# Patient Record
Sex: Male | Born: 1970 | Race: White | Marital: Married | Smoking: Never smoker
Health system: Southern US, Community
[De-identification: ages and names within clinical notes are randomized; demographics above are authoritative.]

## PROBLEM LIST (undated history)

## (undated) DIAGNOSIS — M549 Dorsalgia, unspecified: Secondary | ICD-10-CM

## (undated) HISTORY — PX: FOOT SURGERY: SHX648

---

## 2020-06-28 ENCOUNTER — Other Ambulatory Visit: Payer: Self-pay | Admitting: Family Medicine

## 2020-06-28 DIAGNOSIS — R1012 Left upper quadrant pain: Secondary | ICD-10-CM

## 2020-08-10 ENCOUNTER — Inpatient Hospital Stay: Admission: RE | Admit: 2020-08-10 | Payer: Self-pay | Source: Ambulatory Visit

## 2020-08-10 ENCOUNTER — Other Ambulatory Visit: Payer: Self-pay

## 2020-08-16 ENCOUNTER — Other Ambulatory Visit: Payer: Self-pay | Admitting: Family Medicine

## 2020-08-16 DIAGNOSIS — R1012 Left upper quadrant pain: Secondary | ICD-10-CM

## 2020-08-16 DIAGNOSIS — R0781 Pleurodynia: Secondary | ICD-10-CM

## 2020-09-07 ENCOUNTER — Other Ambulatory Visit: Payer: Self-pay

## 2020-09-07 ENCOUNTER — Ambulatory Visit
Admission: RE | Admit: 2020-09-07 | Discharge: 2020-09-07 | Disposition: A | Discharge status: Home / Self Care | Payer: BC Managed Care – PPO | Source: Ambulatory Visit | Attending: Family Medicine | Admitting: Family Medicine

## 2020-09-07 DIAGNOSIS — R0781 Pleurodynia: Secondary | ICD-10-CM

## 2020-09-07 DIAGNOSIS — R1012 Left upper quadrant pain: Secondary | ICD-10-CM

## 2020-09-22 ENCOUNTER — Ambulatory Visit: Payer: BC Managed Care – PPO | Admitting: Sports Medicine

## 2020-09-22 ENCOUNTER — Other Ambulatory Visit: Payer: Self-pay

## 2020-09-22 VITALS — Ht 67.0 in | Wt 158.0 lb

## 2020-09-22 DIAGNOSIS — R0781 Pleurodynia: Secondary | ICD-10-CM | POA: Insufficient documentation

## 2020-09-22 NOTE — Assessment & Plan Note (Signed)
His symptoms do not seem to be occurring in a particular pattern and cannot be explained by any findings on physical exam or an MRI.  It is possible that there is a nerve impingement occurring in the thoracic region that is causing his pain.  We will discuss this case with Dr. Lucie Leather, PM&R physician at [facility], to see if he thinks a nerve block would be feasible in this situation, as it could likely be diagnostic and therapeutic for this interesting case.  Will go ahead and refer for appointment.

## 2020-09-22 NOTE — Progress Notes (Signed)
   Darius Young is a 50 y.o. male who presents to [facility] today for the following:  Left Rib Pain 2 years Off and on  Occurs randomly PCP referred to Dr. 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" States that the pain can occur randomly States that when he works out at the gym it does not bother him States that sometimes he will be sitting at rest and randomly get the pain Pain is mostly along his 10th rib on the left States that he is unable to lie on his left side at night and it wakes him from sleep States that occasionally the pain is so severe that it shoots from the front of his rib to the back of his ribs and up his left arm and into his fingertips He describes that particular pain is a burning and aching He denies any arm weakness He states it is very variable, sometimes he can go a few weeks without having the pain, other times he can spend over a month with pain every day No radiation to legs  PMH reviewed.  ROS as above. Medications reviewed.  Exam:  Ht 5\' 7"  (1.702 m)   Wt 158 lb (71.7 kg)   BMI 24.75 kg/m  Gen: Well NAD MSK:  Rib, Thoracic and Lumbar spine:  - Inspection: no gross deformity or asymmetry, swelling or ecchymosis - Palpation: No TTP over the spinous processes of thoracic or lumbar spine, paraspinal muscles, or left lower ribs - ROM: full active ROM of the lumbar spine in flexion and extension without pain, very mild thoracic scoliosis  - Strength: 5/5 strength of BLE and BUE, normal gait - Neuro: sensation intact throughout BUE/BLE, 2+ C5-C7 reflexes   Assessment and Plan: 1) Rib pain on left side His symptoms do not seem to be occurring in a particular pattern and cannot be explained by any findings on physical exam or an MRI.  It is  possible that there is a nerve impingement occurring in the thoracic region that is causing his pain.  We will discuss this case with Dr. , PM&R physician at [facility], to see if he thinks a nerve block would be feasible in this situation, as it could likely be diagnostic and therapeutic for this interesting case.  Will go ahead and refer for appointment.     [facility], D.O.  PGY-4 [facility]  09/22/2020 4:34 PM  Patient seen and evaluated with the sports medicine fellow.  I agree with the above plan of care.  Patient's diagnosis is not straightforward.  I did review the MRI of his chest which is negative for any musculoskeletal pathology.  He may benefit from a diagnostic intercostal nerve block.  We will discuss this with Dr. 09/24/2020 at [facility].  Follow-up with me as needed.

## 2020-09-22 NOTE — Patient Instructions (Addendum)
Thank you for coming to see me today. It was a pleasure. Today we talked about:   You will be contacted with an appointment fr Dr. Lucie Leather by [facility].  Their number is [phone] if you don't hear from them by next week.  The address is [address], [address], [place], [place] [address].  We will talk with him about your case and see if he thinks he would be able to help.  If not, we will contact you to cancel the appointment and for next steps.  If you have any questions or concerns, please do not hesitate to call the office at [phone].  Best,   Luis Abed, DO [facility]

## 2020-09-23 NOTE — Progress Notes (Signed)
Referral to Dr. Jake Church at [facility] to discuss left rib pain - possible diagnostic intercostal nerve block.  [address] [place], [place] [address] Phone: [phone]  They will contact pt to schedule appt.

## 2021-05-12 ENCOUNTER — Other Ambulatory Visit: Payer: Self-pay | Admitting: Family Medicine

## 2021-05-12 DIAGNOSIS — R1031 Right lower quadrant pain: Secondary | ICD-10-CM

## 2021-05-12 DIAGNOSIS — R29810 Facial weakness: Secondary | ICD-10-CM

## 2021-05-12 DIAGNOSIS — M542 Cervicalgia: Secondary | ICD-10-CM

## 2021-05-12 DIAGNOSIS — M546 Pain in thoracic spine: Secondary | ICD-10-CM

## 2021-05-18 ENCOUNTER — Ambulatory Visit
Admission: RE | Admit: 2021-05-18 | Discharge: 2021-05-18 | Disposition: A | Discharge status: Home / Self Care | Payer: BC Managed Care – PPO | Source: Ambulatory Visit | Attending: Family Medicine | Admitting: Family Medicine

## 2021-05-18 DIAGNOSIS — R1031 Right lower quadrant pain: Secondary | ICD-10-CM

## 2021-05-26 ENCOUNTER — Other Ambulatory Visit: Payer: Self-pay | Admitting: Family Medicine

## 2021-05-26 DIAGNOSIS — R29810 Facial weakness: Secondary | ICD-10-CM

## 2021-05-26 DIAGNOSIS — R1031 Right lower quadrant pain: Secondary | ICD-10-CM

## 2021-05-26 DIAGNOSIS — M546 Pain in thoracic spine: Secondary | ICD-10-CM

## 2021-05-26 DIAGNOSIS — M542 Cervicalgia: Secondary | ICD-10-CM

## 2021-05-31 ENCOUNTER — Other Ambulatory Visit: Payer: BC Managed Care – PPO

## 2021-05-31 ENCOUNTER — Ambulatory Visit
Admission: RE | Admit: 2021-05-31 | Discharge: 2021-05-31 | Disposition: A | Discharge status: Home / Self Care | Payer: BC Managed Care – PPO | Source: Ambulatory Visit | Attending: Family Medicine | Admitting: Family Medicine

## 2021-05-31 DIAGNOSIS — R29810 Facial weakness: Secondary | ICD-10-CM

## 2021-05-31 DIAGNOSIS — R1031 Right lower quadrant pain: Secondary | ICD-10-CM

## 2021-05-31 DIAGNOSIS — M546 Pain in thoracic spine: Secondary | ICD-10-CM

## 2021-05-31 DIAGNOSIS — M542 Cervicalgia: Secondary | ICD-10-CM

## 2021-05-31 MED ORDER — GADOBENATE DIMEGLUMINE 529 MG/ML IV SOLN
15.0000 mL | Freq: Once | INTRAVENOUS | Status: AC | PRN
  Administered 2021-05-31: 15 mL via INTRAVENOUS

## 2021-06-16 ENCOUNTER — Encounter: Payer: Self-pay | Admitting: Urology

## 2021-06-16 ENCOUNTER — Ambulatory Visit: Payer: BC Managed Care – PPO | Admitting: Urology

## 2021-06-16 VITALS — BP 110/66 | HR 62

## 2021-06-16 DIAGNOSIS — L729 Follicular cyst of the skin and subcutaneous tissue, unspecified: Secondary | ICD-10-CM

## 2021-06-16 DIAGNOSIS — K4090 Unilateral inguinal hernia, without obstruction or gangrene, not specified as recurrent: Secondary | ICD-10-CM

## 2021-06-16 DIAGNOSIS — N433 Hydrocele, unspecified: Secondary | ICD-10-CM

## 2021-06-16 DIAGNOSIS — N50811 Right testicular pain: Secondary | ICD-10-CM | POA: Diagnosis not present

## 2021-06-16 MED ORDER — MELOXICAM 7.5 MG PO TABS: 7.5000 mg | ORAL_TABLET | Freq: Every day | ORAL | 1 refills | Status: DC

## 2021-06-16 NOTE — Progress Notes (Signed)
   06/16/2021 11:36 AM   Gillis Ends March 16, 1970 [id number]  Referring provider: Garnette Gunner, MD [address] [place],  [place] [address]  Right testicular pain   HPI: Mr Bartolucci is a 50yo here for evaluation of right testis pain. Starting over 1 month he developed right testis pain while mowing his lawn. He underwent scrotal US which showed right cystic structure adjacent to right testis and left epididymal head cysts. The pain resolved and then recurred 1 week ago and has persisted. He was lifting when the pain occurred. No significant LUTS. No other complaints today.   PMH: No past medical history on file.  Surgical History:   Home Medications:  Allergies as of 06/16/2021   No Known Allergies      Medication List        Accurate as of Jun 16, 2021 11:36 AM. If you have any questions, ask your nurse or doctor.          cetirizine 10 MG tablet Commonly known as: ZYRTEC Take 10 mg by mouth daily as needed.   ibuprofen 800 MG tablet Commonly known as: ADVIL Take 800 mg by mouth 3 (three) times daily.        Allergies: No Known Allergies  Family History: No family history on file.  Social History:  has no history on file for tobacco use, alcohol use, and drug use.  ROS: All other review of systems were reviewed and are negative except what is noted above in HPI  Physical Exam: BP 110/66   Pulse 62   Constitutional:  Alert and oriented, No acute distress. HEENT: Shell AT, moist mucus membranes.  Trachea midline, no masses. Cardiovascular: No clubbing, cyanosis, or edema. Respiratory: Normal respiratory effort, no increased work of breathing. GI: Abdomen is soft, nontender, nondistended, no abdominal masses GU: No CVA tenderness. Circumcised phallus. No masses/lesions on penis, testis, scrotum.  Small right inguinal hernia Lymph: No cervical or inguinal lymphadenopathy. Skin: No rashes, bruises or suspicious lesions. Neurologic: Grossly intact, no focal  deficits, moving all 4 extremities. Psychiatric: Normal mood and affect.  Laboratory Data: No results found for: WBC, HGB, HCT, MCV, PLT  No results found for: CREATININE  No results found for: PSA  No results found for: TESTOSTERONE  No results found for: HGBA1C  Urinalysis No results found for: COLORURINE, APPEARANCEUR, LABSPEC, PHURINE, GLUCOSEU, HGBUR, BILIRUBINUR, KETONESUR, PROTEINUR, UROBILINOGEN, NITRITE, LEUKOCYTESUR  No results found for: LABMICR, WBCUA, RBCUA, LABEPIT, MUCUS, BACTERIA  Pertinent Imaging: Scrotal US 05/18/2021: Images reviewed and discussed with the patient No results found for this or any previous visit.  No results found for this or any previous visit.  No results found for this or any previous visit.  No results found for this or any previous visit.  No results found for this or any previous visit.  No results found for this or any previous visit.  No results found for this or any previous visit.  No results found for this or any previous visit.   Assessment & Plan:    1. Right inguinal hernia -referral to Dr. Lovell Sheehan  2. Right orchalgia -meloxicam 7.5mg  daily   No follow-ups on file.  Wilkie Aye, MD  [facility] [place]

## 2021-06-16 NOTE — Patient Instructions (Signed)
Hernia, Adult     A hernia is the bulging of an organ or tissue through a weak spot in the muscles of the abdomen. Hernias develop most often near the belly button (navel) or the area where the leg meets the lower abdomen (groin). Common types of hernias include: Incisional hernia. This type bulges through a scar from an abdominal surgery. Umbilical hernia. This type develops near the navel. Inguinal hernia. This type develops in the groin or scrotum. Femoral hernia. This type develops below the groin, in the upper thigh area. Hiatal hernia. This type occurs when part of the stomach slides above the muscle that separates the abdomen from the chest (diaphragm). What are the causes? This condition may be caused by: Heavy lifting. Coughing over a long period of time. Straining to have a bowel movement. Constipation can lead to straining. An incision made during abdominal surgery. A physical problem that is present at birth (congenital defect). Being overweight or obese. Smoking. Excess fluid in the abdomen. Undescended testicles in males. What are the signs or symptoms? The main symptom is a skin-colored, rounded bulge in the area of the hernia. However, a bulge may not always be present. It may grow bigger or be more visible when you cough or strain (such as when lifting something heavy). A hernia that can be pushed back into the abdomen (is reducible) rarely causes pain. A hernia that cannot be pushed back into the abdomen (is incarcerated) may lose its blood supply (become strangulated). A hernia that is incarcerated may cause: Pain. Fever. Nausea and vomiting. Swelling. Constipation. How is this diagnosed? A hernia may be diagnosed based on: Your symptoms and medical history. A physical exam. Your health care provider may ask you to cough or move in certain ways to see if the hernia becomes visible. Imaging tests, such as: X-rays. Ultrasound. CT scan. How is this treated? A  hernia that is small and painless may not need to be treated. A hernia that is large or painful may be treated with surgery. Inguinal hernias may be treated with surgery to prevent incarceration or strangulation. Strangulated hernias are always treated with surgery because the strangulation causes a lack of blood supply to the trapped organ or tissue. Surgery to treat a hernia involves pushing the bulge back into place and repairing the weak area of the muscle or abdominal wall. Follow these instructions at home: Activity Avoid straining. Do not lift anything that is heavier than 10 lb (4.5 kg), or the limit that you are told, until your health care provider says that it is safe. When lifting heavy objects, lift with your leg muscles, not your back muscles. Preventing constipation Take actions to prevent constipation. Constipation leads to straining with bowel movements, which can make a hernia worse or cause a hernia repair to break down. Your health care provider may recommend that you take these actions to prevent or treat constipation: Drink enough fluid to keep your urine pale yellow. Take over-the-counter or prescription medicines. Eat foods that are high in fiber, such as beans, whole grains, and fresh fruits and vegetables. Limit foods that are high in fat and processed sugars, such as fried or sweet foods. General instructions When coughing, try to cough gently. You may try to push the hernia back in place by very gently pressing on it while lying down. Do not try to force the bulge back in if it will not push in easily. If you are overweight, work with your health care provider   to lose weight safely. Do not use any products that contain nicotine or tobacco. These products include cigarettes, chewing tobacco, and vaping devices, such as e-cigarettes. If you need help quitting, ask your health care provider. If you are scheduled for hernia repair, watch your hernia for any changes in shape,  size, or color. Tell your health care provider about any changes or new symptoms. Take over-the-counter and prescription medicines only as told by your health care provider. Keep all follow-up visits. This is important. Contact a health care provider if: You develop new pain, swelling, or redness around your hernia. You have signs of constipation, such as: Fewer bowel movements in a week than normal. Difficulty having a bowel movement. Stools that are dry, hard, or larger than normal. Get help right away if: You have a fever or chills. You have abdominal pain that gets worse. You feel nauseous or you vomit. You cannot push the hernia back in place by very gently pressing on it while lying down. Do not try to force the bulge back in if it will not go in easily. The hernia: Changes in shape, size, or color. Feels hard or tender. These symptoms may represent a serious problem that is an emergency. Do not wait to see if the symptoms will go away. Get medical help right away. Call your local emergency services (911 in the [place] Do not drive yourself to the hospital. Summary A hernia is the bulging of an organ or tissue through a weak spot in the muscles of the abdomen. The main symptom is a skin-colored bulge in the hernia area. However, a bulge may not always be present. It may grow bigger or more visible when you cough or strain (such as when having a bowel movement). A hernia that is small and painless may not need to be treated. A hernia that is large or painful may be treated with surgery. Surgery to treat a hernia involves pushing the bulge back into place and repairing the weak part of the abdomen. This information is not intended to replace advice given to you by your health care provider. Make sure you discuss any questions you have with your health care provider. Document Revised: 08/31/2019 Document Reviewed: 08/31/2019 Elsevier Patient Education  2023 Elsevier Inc.  

## 2021-07-04 ENCOUNTER — Ambulatory Visit: Payer: BC Managed Care – PPO | Admitting: General Surgery

## 2021-07-04 ENCOUNTER — Encounter: Payer: Self-pay | Admitting: General Surgery

## 2021-07-04 VITALS — BP 112/75 | HR 67 | Temp 98.1°F | Resp 12 | Ht 67.0 in | Wt 177.0 lb

## 2021-07-04 DIAGNOSIS — K4090 Unilateral inguinal hernia, without obstruction or gangrene, not specified as recurrent: Secondary | ICD-10-CM

## 2021-07-05 NOTE — Progress Notes (Signed)
Darius Young; [id number]; 24-Jun-1970   HPI Patient is a 51 year old white male who was referred to my care by Dr. Alyson Ingles of urology for evaluation and treatment of a right inguinal hernia.  Patient was seeing Dr. Alyson Ingles for right testicular pain.  He was noted on examination to have a small right inguinal hernia.  Patient states that when he strains, he does have intermittent pain in the right groin area that radiates down to the right testicle.  He has had this for many months but is now starting to bother him more.  It is made worse with working out. History reviewed. No pertinent past medical history.  History reviewed. No pertinent surgical history.  History reviewed. No pertinent family history.  Current Outpatient Medications on File Prior to Visit  Medication Sig Dispense Refill   cetirizine (ZYRTEC) 10 MG tablet Take 10 mg by mouth daily as needed.     ibuprofen (ADVIL) 800 MG tablet Take 800 mg by mouth 3 (three) times daily.     meloxicam (MOBIC) 7.5 MG tablet Take 1 tablet (7.5 mg total) by mouth daily. 30 tablet 1   No current facility-administered medications on file prior to visit.    No Known Allergies  Social History   Substance and Sexual Activity  Alcohol Use Not Currently   Comment: Social    Social History   Tobacco Use  Smoking Status Never  Smokeless Tobacco Former   Types: Chew    Review of Systems  Constitutional: Negative.   HENT:  Positive for sinus pain.   Eyes: Negative.   Respiratory: Negative.    Cardiovascular: Negative.   Gastrointestinal:  Positive for heartburn.  Genitourinary:  Positive for frequency.  Musculoskeletal:  Positive for back pain, joint pain and neck pain.  Skin: Negative.   Neurological: Negative.   Endo/Heme/Allergies: Negative.   Psychiatric/Behavioral: Negative.     Objective   Vitals:   07/04/21 1436  BP: 112/75  Pulse: 67  Resp: 12  Temp: 98.1 F (36.7 C)  SpO2: 98%    Physical Exam Vitals  reviewed.  Constitutional:      Appearance: Normal appearance. He is normal weight. He is not ill-appearing.  HENT:     Head: Normocephalic and atraumatic.  Cardiovascular:     Rate and Rhythm: Normal rate and regular rhythm.     Heart sounds: Normal heart sounds. No murmur heard.   No friction rub. No gallop.  Pulmonary:     Effort: Pulmonary effort is normal. No respiratory distress.     Breath sounds: Normal breath sounds. No stridor. No wheezing, rhonchi or rales.  Abdominal:     General: Abdomen is flat. Bowel sounds are normal. There is no distension.     Palpations: Abdomen is soft. There is no mass.     Tenderness: There is no abdominal tenderness. There is no guarding or rebound.     Hernia: A hernia is present.     Comments: Small easily reducible right inguinal hernia noted.  Palpation over the internal ring recreates his pain.  Genitourinary:    Testes: Normal.  Skin:    General: Skin is warm and dry.  Neurological:     Mental Status: He is alert and oriented to person, place, and time.   Urology notes reviewed Assessment  Right inguinal hernia Plan  Patient is scheduled for right inguinal herniorrhaphy with mesh on 07/21/2021.  The risks and benefits of the procedure including bleeding, infection, mesh use, the possibility of recurrence  of the hernia were fully explained to the patient, who gave informed consent.

## 2021-07-05 NOTE — H&P (Signed)
Darius Young; [id number]; 01/16/1971   HPI Patient is a 50-year-old white male who was referred to my care by Dr. McKenzie of urology for evaluation and treatment of a right inguinal hernia.  Patient was seeing Dr. McKenzie for right testicular pain.  He was noted on examination to have a small right inguinal hernia.  Patient states that when he strains, he does have intermittent pain in the right groin area that radiates down to the right testicle.  He has had this for many months but is now starting to bother him more.  It is made worse with working out. History reviewed. No pertinent past medical history.  History reviewed. No pertinent surgical history.  History reviewed. No pertinent family history.  Current Outpatient Medications on File Prior to Visit  Medication Sig Dispense Refill   cetirizine (ZYRTEC) 10 MG tablet Take 10 mg by mouth daily as needed.     ibuprofen (ADVIL) 800 MG tablet Take 800 mg by mouth 3 (three) times daily.     meloxicam (MOBIC) 7.5 MG tablet Take 1 tablet (7.5 mg total) by mouth daily. 30 tablet 1   No current facility-administered medications on file prior to visit.    No Known Allergies  Social History   Substance and Sexual Activity  Alcohol Use Not Currently   Comment: Social    Social History   Tobacco Use  Smoking Status Never  Smokeless Tobacco Former   Types: Chew    Review of Systems  Constitutional: Negative.   HENT:  Positive for sinus pain.   Eyes: Negative.   Respiratory: Negative.    Cardiovascular: Negative.   Gastrointestinal:  Positive for heartburn.  Genitourinary:  Positive for frequency.  Musculoskeletal:  Positive for back pain, joint pain and neck pain.  Skin: Negative.   Neurological: Negative.   Endo/Heme/Allergies: Negative.   Psychiatric/Behavioral: Negative.     Objective   Vitals:   07/04/21 1436  BP: 112/75  Pulse: 67  Resp: 12  Temp: 98.1 F (36.7 C)  SpO2: 98%    Physical Exam Vitals  reviewed.  Constitutional:      Appearance: Normal appearance. He is normal weight. He is not ill-appearing.  HENT:     Head: Normocephalic and atraumatic.  Cardiovascular:     Rate and Rhythm: Normal rate and regular rhythm.     Heart sounds: Normal heart sounds. No murmur heard.   No friction rub. No gallop.  Pulmonary:     Effort: Pulmonary effort is normal. No respiratory distress.     Breath sounds: Normal breath sounds. No stridor. No wheezing, rhonchi or rales.  Abdominal:     General: Abdomen is flat. Bowel sounds are normal. There is no distension.     Palpations: Abdomen is soft. There is no mass.     Tenderness: There is no abdominal tenderness. There is no guarding or rebound.     Hernia: A hernia is present.     Comments: Small easily reducible right inguinal hernia noted.  Palpation over the internal ring recreates his pain.  Genitourinary:    Testes: Normal.  Skin:    General: Skin is warm and dry.  Neurological:     Mental Status: He is alert and oriented to person, place, and time.   Urology notes reviewed Assessment  Right inguinal hernia Plan  Patient is scheduled for right inguinal herniorrhaphy with mesh on 07/21/2021.  The risks and benefits of the procedure including bleeding, infection, mesh use, the possibility of recurrence   of the hernia were fully explained to the patient, who gave informed consent. 

## 2021-07-14 ENCOUNTER — Ambulatory Visit: Payer: BC Managed Care – PPO | Admitting: Urology

## 2021-07-19 ENCOUNTER — Encounter (HOSPITAL_COMMUNITY): Payer: Self-pay

## 2021-07-19 ENCOUNTER — Encounter (HOSPITAL_COMMUNITY)
Admission: RE | Admit: 2021-07-19 | Discharge: 2021-07-19 | Disposition: A | Discharge status: Home / Self Care | Payer: BC Managed Care – PPO | Source: Ambulatory Visit | Attending: General Surgery | Admitting: General Surgery

## 2021-07-19 HISTORY — DX: Dorsalgia, unspecified: M54.9

## 2021-07-21 ENCOUNTER — Other Ambulatory Visit: Payer: Self-pay

## 2021-07-21 ENCOUNTER — Ambulatory Visit (HOSPITAL_COMMUNITY): Payer: BC Managed Care – PPO

## 2021-07-21 ENCOUNTER — Encounter (HOSPITAL_COMMUNITY)
Admission: RE | Disposition: A | Discharge status: Home / Self Care | Payer: Self-pay | Source: Home / Self Care | Attending: General Surgery

## 2021-07-21 ENCOUNTER — Ambulatory Visit (HOSPITAL_COMMUNITY)
Admission: RE | Admit: 2021-07-21 | Discharge: 2021-07-21 | Disposition: A | Discharge status: Home / Self Care | Payer: BC Managed Care – PPO | Attending: General Surgery | Admitting: General Surgery

## 2021-07-21 ENCOUNTER — Encounter (HOSPITAL_COMMUNITY): Payer: Self-pay | Admitting: General Surgery

## 2021-07-21 DIAGNOSIS — K4090 Unilateral inguinal hernia, without obstruction or gangrene, not specified as recurrent: Secondary | ICD-10-CM

## 2021-07-21 SURGERY — REPAIR, HERNIA, INGUINAL, ADULT
Anesthesia: General | Site: Inguinal | Laterality: Right

## 2021-07-21 MED ORDER — KETOROLAC TROMETHAMINE 30 MG/ML IJ SOLN
INTRAMUSCULAR | Status: DC | PRN
  Administered 2021-07-21 (×2): 15 mg via INTRAVENOUS

## 2021-07-21 MED ORDER — LACTATED RINGERS IV SOLN: INTRAVENOUS | Status: DC

## 2021-07-21 MED ORDER — MIDAZOLAM HCL 2 MG/2ML IJ SOLN
INTRAMUSCULAR | Status: AC
  Filled 2021-07-21: qty 2

## 2021-07-21 MED ORDER — CHLORHEXIDINE GLUCONATE CLOTH 2 % EX PADS: 6.0000 | MEDICATED_PAD | Freq: Once | CUTANEOUS | Status: DC

## 2021-07-21 MED ORDER — PROPOFOL 10 MG/ML IV BOLUS
INTRAVENOUS | Status: AC
  Filled 2021-07-21: qty 20

## 2021-07-21 MED ORDER — CEFAZOLIN SODIUM-DEXTROSE 2-4 GM/100ML-% IV SOLN
2.0000 g | INTRAVENOUS | Status: AC
  Administered 2021-07-21: 2 g via INTRAVENOUS
  Filled 2021-07-21: qty 100

## 2021-07-21 MED ORDER — KETOROLAC TROMETHAMINE 30 MG/ML IJ SOLN
INTRAMUSCULAR | Status: AC
  Filled 2021-07-21: qty 1

## 2021-07-21 MED ORDER — LACTATED RINGERS IV SOLN: INTRAVENOUS | Status: DC | PRN

## 2021-07-21 MED ORDER — ONDANSETRON HCL 4 MG/2ML IJ SOLN
INTRAMUSCULAR | Status: DC | PRN
  Administered 2021-07-21: 4 mg via INTRAVENOUS

## 2021-07-21 MED ORDER — CHLORHEXIDINE GLUCONATE 0.12 % MT SOLN: 15.0000 mL | Freq: Once | OROMUCOSAL | Status: DC

## 2021-07-21 MED ORDER — FENTANYL CITRATE (PF) 100 MCG/2ML IJ SOLN
INTRAMUSCULAR | Status: DC | PRN
  Administered 2021-07-21 (×2): 50 ug via INTRAVENOUS

## 2021-07-21 MED ORDER — BUPIVACAINE HCL (300 MG DOSE) 3 X 100 MG IL IMPL
DRUG_IMPLANT | Status: AC
  Filled 2021-07-21: qty 100

## 2021-07-21 MED ORDER — DEXAMETHASONE SODIUM PHOSPHATE 10 MG/ML IJ SOLN
INTRAMUSCULAR | Status: AC
  Filled 2021-07-21: qty 1

## 2021-07-21 MED ORDER — PROPOFOL 10 MG/ML IV BOLUS
INTRAVENOUS | Status: DC | PRN
  Administered 2021-07-21: 200 mg via INTRAVENOUS

## 2021-07-21 MED ORDER — FENTANYL CITRATE (PF) 100 MCG/2ML IJ SOLN
INTRAMUSCULAR | Status: AC
  Filled 2021-07-21: qty 2

## 2021-07-21 MED ORDER — ONDANSETRON HCL 4 MG/2ML IJ SOLN
INTRAMUSCULAR | Status: AC
  Filled 2021-07-21: qty 2

## 2021-07-21 MED ORDER — OXYCODONE HCL 5 MG PO TABS
5.0000 mg | ORAL_TABLET | Freq: Once | ORAL | Status: AC | PRN
  Administered 2021-07-21: 5 mg via ORAL
  Filled 2021-07-21: qty 1

## 2021-07-21 MED ORDER — OXYCODONE HCL 5 MG/5ML PO SOLN: 5.0000 mg | Freq: Once | ORAL | Status: AC | PRN

## 2021-07-21 MED ORDER — ONDANSETRON HCL 4 MG/2ML IJ SOLN
4.0000 mg | Freq: Once | INTRAMUSCULAR | Status: AC | PRN
  Administered 2021-07-21: 4 mg via INTRAVENOUS
  Filled 2021-07-21: qty 2

## 2021-07-21 MED ORDER — LIDOCAINE HCL (PF) 2 % IJ SOLN
INTRAMUSCULAR | Status: AC
  Filled 2021-07-21: qty 5

## 2021-07-21 MED ORDER — SODIUM CHLORIDE 0.9 % IR SOLN
Status: DC | PRN
  Administered 2021-07-21: 1000 mL

## 2021-07-21 MED ORDER — OXYCODONE HCL 5 MG PO TABS: 5.0000 mg | ORAL_TABLET | ORAL | 0 refills | Status: DC | PRN

## 2021-07-21 MED ORDER — BUPIVACAINE HCL (300 MG DOSE) 3 X 100 MG IL IMPL
DRUG_IMPLANT | Status: DC | PRN
  Administered 2021-07-21: 250 mg

## 2021-07-21 MED ORDER — ORAL CARE MOUTH RINSE: 15.0000 mL | Freq: Once | OROMUCOSAL | Status: DC

## 2021-07-21 MED ORDER — MIDAZOLAM HCL 5 MG/5ML IJ SOLN
INTRAMUSCULAR | Status: DC | PRN
  Administered 2021-07-21: 2 mg via INTRAVENOUS

## 2021-07-21 MED ORDER — FENTANYL CITRATE PF 50 MCG/ML IJ SOSY
25.0000 ug | PREFILLED_SYRINGE | INTRAMUSCULAR | Status: DC | PRN
  Administered 2021-07-21 (×2): 50 ug via INTRAVENOUS
  Filled 2021-07-21 (×2): qty 1

## 2021-07-21 MED ORDER — LIDOCAINE HCL (CARDIAC) PF 100 MG/5ML IV SOSY
PREFILLED_SYRINGE | INTRAVENOUS | Status: DC | PRN
  Administered 2021-07-21: 100 mg via INTRAVENOUS

## 2021-07-21 MED ORDER — DEXAMETHASONE SODIUM PHOSPHATE 4 MG/ML IJ SOLN
INTRAMUSCULAR | Status: DC | PRN
  Administered 2021-07-21: 5 mg via INTRAVENOUS

## 2021-07-21 SURGICAL SUPPLY — 29 items
ADH SKN CLS APL DERMABOND .7 (GAUZE/BANDAGES/DRESSINGS) ×1
CLOTH BEACON ORANGE TIMEOUT ST (SAFETY) ×2 IMPLANT
COVER LIGHT HANDLE STERIS (MISCELLANEOUS) ×4 IMPLANT
DERMABOND ADVANCED (GAUZE/BANDAGES/DRESSINGS) ×1
DERMABOND ADVANCED .7 DNX12 (GAUZE/BANDAGES/DRESSINGS) ×1 IMPLANT
DRAIN PENROSE 0.5X18 (DRAIN) ×2 IMPLANT
ELECT REM PT RETURN 9FT ADLT (ELECTROSURGICAL) ×2
ELECTRODE REM PT RTRN 9FT ADLT (ELECTROSURGICAL) ×1 IMPLANT
GAUZE 4X4 16PLY ~~LOC~~+RFID DBL (SPONGE) ×2 IMPLANT
GAUZE SPONGE 4X4 12PLY STRL (GAUZE/BANDAGES/DRESSINGS) ×2 IMPLANT
GLOVE BIOGEL PI IND STRL 7.0 (GLOVE) ×2 IMPLANT
GLOVE BIOGEL PI INDICATOR 7.0 (GLOVE) ×2
GLOVE SURG SS PI 7.5 STRL IVOR (GLOVE) ×2 IMPLANT
GOWN STRL REUS W/TWL LRG LVL3 (GOWN DISPOSABLE) ×6 IMPLANT
INST SET MINOR GENERAL (KITS) ×2 IMPLANT
KIT TURNOVER KIT A (KITS) ×2 IMPLANT
MANIFOLD NEPTUNE II (INSTRUMENTS) ×2 IMPLANT
MESH MARLEX PLUG MEDIUM (Mesh General) ×1 IMPLANT
NS IRRIG 1000ML POUR BTL (IV SOLUTION) ×2 IMPLANT
PACK MINOR (CUSTOM PROCEDURE TRAY) ×2 IMPLANT
PAD ARMBOARD 7.5X6 YLW CONV (MISCELLANEOUS) ×2 IMPLANT
SET BASIN LINEN APH (SET/KITS/TRAYS/PACK) ×2 IMPLANT
SOL PREP PROV IODINE SCRUB 4OZ (MISCELLANEOUS) ×2 IMPLANT
SUT MNCRL AB 4-0 PS2 18 (SUTURE) ×2 IMPLANT
SUT NOVA NAB GS-22 2 2-0 T-19 (SUTURE) ×4 IMPLANT
SUT VIC AB 2-0 CT1 27 (SUTURE) ×2
SUT VIC AB 2-0 CT1 TAPERPNT 27 (SUTURE) ×1 IMPLANT
SUT VIC AB 3-0 SH 27 (SUTURE) ×2
SUT VIC AB 3-0 SH 27X BRD (SUTURE) ×1 IMPLANT

## 2021-07-21 NOTE — Transfer of Care (Signed)
Immediate Anesthesia Transfer of Care Note  Patient: Darius Young  Procedure(s) Performed: HERNIA REPAIR INGUINAL ADULT (Right: Inguinal)  Patient Location: PACU  Anesthesia Type:General  Level of Consciousness: drowsy  Airway & Oxygen Therapy: Patient Spontanous Breathing  Post-op Assessment: Report given to RN and Post -op Vital signs reviewed and stable  Post vital signs: Reviewed and stable  Last Vitals:  Vitals Value Taken Time  BP 97/54   Temp    Pulse 64 07/21/21 0828  Resp 14 07/21/21 0828  SpO2 92 % 07/21/21 0828  Vitals shown include unvalidated device data.  Last Pain:  Vitals:   07/21/21 0634  TempSrc: Oral  PainSc: 1       Patients Stated Pain Goal: 8 (07/21/21 9166)  Complications: No notable events documented.

## 2021-07-21 NOTE — Anesthesia Preprocedure Evaluation (Signed)
Anesthesia Evaluation  Patient identified by MRN, date of birth, ID band Patient awake    Reviewed: Allergy & Precautions, H&P , NPO status , Patient's Chart, lab work & pertinent test results, reviewed documented beta blocker date and time   Airway Mallampati: II  TM Distance: >3 FB Neck ROM: full    Dental no notable dental hx.    Pulmonary neg pulmonary ROS,    Pulmonary exam normal breath sounds clear to auscultation       Cardiovascular Exercise Tolerance: Good negative cardio ROS   Rhythm:regular Rate:Normal     Neuro/Psych negative neurological ROS  negative psych ROS   GI/Hepatic negative GI ROS, Neg liver ROS,   Endo/Other  negative endocrine ROS  Renal/GU negative Renal ROS  negative genitourinary   Musculoskeletal   Abdominal   Peds  Hematology negative hematology ROS (+)   Anesthesia Other Findings   Reproductive/Obstetrics negative OB ROS                             Anesthesia Physical Anesthesia Plan  ASA: 1  Anesthesia Plan: General and General LMA   Post-op Pain Management:    Induction:   PONV Risk Score and Plan: Ondansetron  Airway Management Planned:   Additional Equipment:   Intra-op Plan:   Post-operative Plan:   Informed Consent: I have reviewed the patients History and Physical, chart, labs and discussed the procedure including the risks, benefits and alternatives for the proposed anesthesia with the patient or authorized representative who has indicated his/her understanding and acceptance.     Dental Advisory Given  Plan Discussed with: CRNA  Anesthesia Plan Comments:         Anesthesia Quick Evaluation  

## 2021-07-21 NOTE — Anesthesia Procedure Notes (Signed)
Procedure Name: LMA Insertion Date/Time: 07/21/2021 7:35 AM  Performed by: Windell Norfolk, MDPre-anesthesia Checklist: Patient identified, Emergency Drugs available, Suction available, Patient being monitored and Timeout performed Patient Re-evaluated:Patient Re-evaluated prior to induction Oxygen Delivery Method: Circle system utilized Preoxygenation: Pre-oxygenation with 100% oxygen Induction Type: IV induction LMA: LMA inserted LMA Size: 5.0 Number of attempts: 1 Airway Equipment and Method: Patient positioned with wedge pillow Dental Injury: Teeth and Oropharynx as per pre-operative assessment

## 2021-07-21 NOTE — Interval H&P Note (Signed)
History and Physical Interval Note:  07/21/2021 7:20 AM  Darius Young  has presented today for surgery, with the diagnosis of RIGHT INGUINAL HERNIA.  The various methods of treatment have been discussed with the patient and family. After consideration of risks, benefits and other options for treatment, the patient has consented to  Procedure(s): HERNIA REPAIR INGUINAL ADULT (Right) as a surgical intervention.  The patient's history has been reviewed, patient examined, no change in status, stable for surgery.  I have reviewed the patient's chart and labs.  Questions were answered to the patient's satisfaction.     Franky Macho

## 2021-07-21 NOTE — Op Note (Signed)
Patient:  Darius Young  DOB:  1970/06/30  MRN:  [id number]   Preop Diagnosis: Right inguinal hernia  Postop Diagnosis: Same  Procedure: Right inguinal herniorrhaphy with mesh  Surgeon: Franky Macho, MD  Anes: General  Indications: Patient is a 51 year old white male who presents with symptomatic right inguinal hernia.  The risks and benefits of the procedure including bleeding, infection, mesh use, and the possibility of recurrence of the hernia were fully explained to the patient, who gave informed consent.  Procedure note: The patient was placed in supine position.  After general anesthesia was administered, the right groin region was prepped and draped using usual sterile technique with Betadine.  Surgical site confirmation was performed.  Incision was made in the right groin region down to the external oblique aponeurosis.  The aponeurosis was incised to the external ring.  A Penrose drain was placed around the spermatic cord.  The vas deferens was noted within the spermatic cord.  The ilioinguinal nerve was identified and retracted superiorly from the operative field.  The patient was noted to have an indirect hernia.  The hernia sac was freed away from the spermatic cord up to the peritoneal reflection and inverted.  A medium size Bard PerFix plug was then placed.  An onlay patch was placed along the floor of the inguinal canal and secured superiorly to the conjoined tendon and inferiorly to the shelving edge of Poupart's ligament using 2-0 Novafil interrupted sutures.  The internal ring was recreated using a 2-0 Novafil interrupted suture.  Darius Young was placed over the mesh and in the subcutaneous tissue.  The external oblique aponeurosis was reapproximated using a 2-0 Vicryl running suture.  The subcutaneous layer was reapproximated using a 3-0 Vicryl interrupted suture.  The skin was closed using a 4-0 Monocryl subcuticular suture.  Dermabond was applied.  All tape and needle counts  were correct at the end of the procedure.  The patient was awakened and transferred to PACU in stable condition.  Complications: None  EBL: Minimal  Specimen: None

## 2021-07-21 NOTE — Anesthesia Postprocedure Evaluation (Signed)
Anesthesia Post Note  Patient: Darius Young  Procedure(s) Performed: HERNIA REPAIR INGUINAL ADULT WITH MESH (Right: Inguinal)  Patient location during evaluation: Phase II Anesthesia Type: General Level of consciousness: awake Pain management: pain level controlled Vital Signs Assessment: post-procedure vital signs reviewed and stable Respiratory status: spontaneous breathing and respiratory function stable Cardiovascular status: blood pressure returned to baseline and stable Postop Assessment: no headache and no apparent nausea or vomiting Anesthetic complications: no Comments: Late entry   No notable events documented.   Last Vitals:  Vitals:   07/21/21 0915 07/21/21 0936  BP: 114/80 102/70  Pulse: 83 67  Resp: 11 18  Temp:  (!) 36.4 C  SpO2: 96% 95%    Last Pain:  Vitals:   07/21/21 0936  TempSrc: Oral  PainSc: 1                  Windell Norfolk

## 2021-07-27 ENCOUNTER — Encounter (HOSPITAL_COMMUNITY): Payer: Self-pay | Admitting: General Surgery

## 2021-07-28 ENCOUNTER — Encounter: Payer: Self-pay | Admitting: General Surgery

## 2021-07-28 ENCOUNTER — Ambulatory Visit (INDEPENDENT_AMBULATORY_CARE_PROVIDER_SITE_OTHER): Payer: BC Managed Care – PPO | Admitting: General Surgery

## 2021-07-28 DIAGNOSIS — Z09 Encounter for follow-up examination after completed treatment for conditions other than malignant neoplasm: Secondary | ICD-10-CM

## 2021-08-04 ENCOUNTER — Other Ambulatory Visit: Payer: Self-pay | Admitting: Urology

## 2021-08-11 ENCOUNTER — Encounter: Payer: Self-pay | Admitting: Urology

## 2021-08-11 ENCOUNTER — Ambulatory Visit (INDEPENDENT_AMBULATORY_CARE_PROVIDER_SITE_OTHER): Payer: BC Managed Care – PPO | Admitting: Urology

## 2021-08-11 VITALS — BP 98/60 | HR 62

## 2021-08-11 DIAGNOSIS — N50811 Right testicular pain: Secondary | ICD-10-CM

## 2021-08-11 MED ORDER — SULFAMETHOXAZOLE-TRIMETHOPRIM 800-160 MG PO TABS: 1.0000 | ORAL_TABLET | Freq: Two times a day (BID) | ORAL | 0 refills | Status: DC

## 2021-08-11 NOTE — Progress Notes (Signed)
08/11/2021 12:42 PM   Darius Young 1970-09-21 [id number]  Referring provider: Lianne Moris, PA-C [address] [place],  [place] [address]  Right testicular pain   HPI: Mr Darius Young is a 50yo here for followup for right testis pain. He underwent right inguinal hernia repair 3 weeks ago and his right testis pain has not improved. The pain is dull, intermittnet, mild to moderate and nonraditing. No pain with activity. NO issues with urination   PMH: Past Medical History:  Diagnosis Date   Back pain     Surgical History: Past Surgical History:  Procedure Laterality Date   FOOT SURGERY Right    INGUINAL HERNIA REPAIR Right 07/21/2021   Procedure: HERNIA REPAIR INGUINAL ADULT WITH MESH;  Surgeon: Darius Macho, MD;  Location: [facility];  Service: General;  Laterality: Right;    Home Medications:  Allergies as of 08/11/2021   No Known Allergies      Medication List        Accurate as of August 11, 2021 12:42 PM. If you have any questions, ask your nurse or doctor.          cetirizine 10 MG tablet Commonly known as: ZYRTEC Take 10 mg by mouth daily as needed for allergies.   fluticasone 50 MCG/ACT nasal spray Commonly known as: FLONASE Place 1 spray into both nostrils daily as needed for allergies or rhinitis.   ibuprofen 800 MG tablet Commonly known as: ADVIL Take 800 mg by mouth every 8 (eight) hours as needed for moderate pain.   meloxicam 15 MG tablet Commonly known as: MOBIC Take 15 mg by mouth daily.   meloxicam 7.5 MG tablet Commonly known as: Mobic Take 1 tablet (7.5 mg total) by mouth daily.   oxyCODONE 5 MG immediate release tablet Commonly known as: Roxicodone Take 1 tablet (5 mg total) by mouth every 4 (four) hours as needed for severe pain.        Allergies: No Known Allergies  Family History: No family history on file.  Social History:  reports that he has never smoked. He has quit using smokeless tobacco.  His smokeless tobacco use included chew. He  reports that he does not currently use alcohol. He reports that he does not use drugs.  ROS: All other review of systems were reviewed and are negative except what is noted above in HPI  Physical Exam: BP 98/60   Pulse 62   Constitutional:  Alert and oriented, No acute distress. HEENT: [place] AT, moist mucus membranes.  Trachea midline, no masses. Cardiovascular: No clubbing, cyanosis, or edema. Respiratory: Normal respiratory effort, no increased work of breathing. GI: Abdomen is soft, nontender, nondistended, no abdominal masses GU: No CVA tenderness. Circumcised phallus. No masses/lesions on penis, testis, scrotum. Lymph: No cervical or inguinal lymphadenopathy. Skin: No rashes, bruises or suspicious lesions. Neurologic: Grossly intact, no focal deficits, moving all 4 extremities. Psychiatric: Normal mood and affect.  Laboratory Data: No results found for: "WBC", "HGB", "HCT", "MCV", "PLT"  No results found for: "CREATININE"  No results found for: "PSA"  No results found for: "TESTOSTERONE"  No results found for: "HGBA1C"  Urinalysis No results found for: "COLORURINE", "APPEARANCEUR", "LABSPEC", "PHURINE", "GLUCOSEU", "HGBUR", "BILIRUBINUR", "KETONESUR", "PROTEINUR", "UROBILINOGEN", "NITRITE", "LEUKOCYTESUR"  No results found for: "LABMICR", "WBCUA", "RBCUA", "LABEPIT", "MUCUS", "BACTERIA"  Pertinent Imaging:  No results found for this or any previous visit.  No results found for this or any previous visit.  No results found for this or any previous visit.  No  results found for this or any previous visit.  No results found for this or any previous visit.  No results found for this or any previous visit.  No results found for this or any previous visit.  No results found for this or any previous visit.   Assessment & Plan:    1. Pain in right testicle Patient educated on scrotal support. He has moderate right epididymal pain and we will treat for epididymitis -  Urinalysis, Routine w reflex microscopic   No follow-ups on file.  Darius Aye, MD  [facility] [place]

## 2021-09-04 ENCOUNTER — Ambulatory Visit (INDEPENDENT_AMBULATORY_CARE_PROVIDER_SITE_OTHER): Payer: BC Managed Care – PPO | Admitting: Urology

## 2021-09-04 VITALS — BP 100/64 | HR 76

## 2021-09-04 DIAGNOSIS — N50811 Right testicular pain: Secondary | ICD-10-CM | POA: Diagnosis not present

## 2021-09-04 NOTE — Progress Notes (Signed)
09/04/2021 4:25 PM   Gillis Ends 1970-08-30 [id number]  Referring provider: Lianne Moris, PA-C [address] [place],  [place] [address]  Right testicular pain   HPI: Mr Cozort is a 51yo here for followup for right testis/epididymal pain. His pain is unimproved since last visit. The pain is sharp and is related to palpating his epididymis. It is worse when sitting. No other complaints today   PMH: Past Medical History:  Diagnosis Date   Back pain     Surgical History: Past Surgical History:  Procedure Laterality Date   FOOT SURGERY Right    INGUINAL HERNIA REPAIR Right 07/21/2021   Procedure: HERNIA REPAIR INGUINAL ADULT WITH MESH;  Surgeon: Franky Macho, MD;  Location: [facility];  Service: General;  Laterality: Right;    Home Medications:  Allergies as of 09/04/2021   No Known Allergies      Medication List        Accurate as of September 04, 2021  4:25 PM. If you have any questions, ask your nurse or doctor.          cetirizine 10 MG tablet Commonly known as: ZYRTEC Take 10 mg by mouth daily as needed for allergies.   fluticasone 50 MCG/ACT nasal spray Commonly known as: FLONASE Place 1 spray into both nostrils daily as needed for allergies or rhinitis.   ibuprofen 800 MG tablet Commonly known as: ADVIL Take 800 mg by mouth every 8 (eight) hours as needed for moderate pain.   meloxicam 15 MG tablet Commonly known as: MOBIC Take 15 mg by mouth daily.   meloxicam 7.5 MG tablet Commonly known as: MOBIC Take 1 tablet by mouth once daily   oxyCODONE 5 MG immediate release tablet Commonly known as: Roxicodone Take 1 tablet (5 mg total) by mouth every 4 (four) hours as needed for severe pain.   sulfamethoxazole-trimethoprim 800-160 MG tablet Commonly known as: BACTRIM DS Take 1 tablet by mouth 2 (two) times daily.        Allergies: No Known Allergies  Family History: No family history on file.  Social History:  reports that he has never smoked. He has  quit using smokeless tobacco.  His smokeless tobacco use included chew. He reports that he does not currently use alcohol. He reports that he does not use drugs.  ROS: All other review of systems were reviewed and are negative except what is noted above in HPI  Physical Exam: BP 100/64   Pulse 76   Constitutional:  Alert and oriented, No acute distress. HEENT: [place] AT, moist mucus membranes.  Trachea midline, no masses. Cardiovascular: No clubbing, cyanosis, or edema. Respiratory: Normal respiratory effort, no increased work of breathing. GI: Abdomen is soft, nontender, nondistended, no abdominal masses GU: No CVA tenderness.  Lymph: No cervical or inguinal lymphadenopathy. Skin: No rashes, bruises or suspicious lesions. Neurologic: Grossly intact, no focal deficits, moving all 4 extremities. Psychiatric: Normal mood and affect.  Laboratory Data: No results found for: "WBC", "HGB", "HCT", "MCV", "PLT"  No results found for: "CREATININE"  No results found for: "PSA"  No results found for: "TESTOSTERONE"  No results found for: "HGBA1C"  Urinalysis No results found for: "COLORURINE", "APPEARANCEUR", "LABSPEC", "PHURINE", "GLUCOSEU", "HGBUR", "BILIRUBINUR", "KETONESUR", "PROTEINUR", "UROBILINOGEN", "NITRITE", "LEUKOCYTESUR"  No results found for: "LABMICR", "WBCUA", "RBCUA", "LABEPIT", "MUCUS", "BACTERIA"  Pertinent Imaging:  No results found for this or any previous visit.  No results found for this or any previous visit.  No results found for this or any  previous visit.  No results found for this or any previous visit.  No results found for this or any previous visit.  No results found for this or any previous visit.  No results found for this or any previous visit.  No results found for this or any previous visit.   Assessment & Plan:    1. Pain in right testicle We discussed NSAIS/scortal support versus neurolysis versus epididymectomy and after discussing the  options the patient elects for right epididymectomy. Risks/benefits/alternatives discussed   No follow-ups on file.  Wilkie Aye, MD  [facility] [place]

## 2021-09-07 ENCOUNTER — Telehealth: Payer: Self-pay

## 2021-09-07 NOTE — Telephone Encounter (Signed)
I spoke with Darius Young. We have discussed possible surgery dates and 10/05/2021 was agreed upon by all parties. Patient given information about surgery date, what to expect pre-operatively and post operatively.  Patient requested surgery time in afternoon due to transportation    We discussed that a pre-op nurse will be calling to set up the pre-op visit that will take place prior to surgery. Informed patient that our office will communicate any additional care to be provided after surgery.    Patients questions or concerns were discussed during our call. Advised to call our office should there be any additional information, questions or concerns that arise. Patient verbalized understanding.

## 2021-09-07 NOTE — Telephone Encounter (Signed)
Received surgical posting sheet. Left message for patient to return call to discuss surgery dates.

## 2021-09-10 ENCOUNTER — Encounter: Payer: Self-pay | Admitting: Urology

## 2021-09-10 NOTE — Patient Instructions (Signed)
Epididymectomy An epididymectomy is a surgery to remove the epididymis. The epididymis is a tiny, tightly coiled tube that stores sperm. Males have two of these tubes, one over each of the testicles. The goal of this procedure is to relieve long-term (chronic) pain in a testicle when the pain cannot be controlled by medicine. This procedure may be done if there is: An injury to the groin. An infection or pus-filled lump (abscess). A fluid-filled sac (cyst). A tumor. Pain that does not go away, such as after a vasectomy. Tell a health care provider about: Any allergies you have. All medicines you are taking, including vitamins, herbs, eye drops, creams, and over-the-counter medicines. Any problems you or family members have had with anesthetic medicines. Any bleeding problems you have. Any surgeries you have had. Any medical conditions you have. What are the risks? Generally, this is a safe procedure. However, problems may occur, including: Blood collecting inside the sac that holds your testicles (scrotum). Allergic reactions to medicines. Infection. Damage to nearby structures or organs, including shrinking of your testicle. Being unable to have children (being infertile). What happens before the procedure? When to stop eating and drinking Follow instructions from your health care provider about what you may eat and drink before your procedure. These may include: 8 hours before your procedure Stop eating most foods. Do not eat meat, fried foods, or fatty foods. Eat only light foods, such as toast or crackers. All liquids are okay except energy drinks and alcohol. 6 hours before your procedure Stop eating. Drink only clear liquids, such as water, clear fruit juice, black coffee, plain tea, and sports drinks. Do not drink energy drinks or alcohol. 2 hours before your procedure Stop drinking all liquids. You may be allowed to take medicines with small sips of water. If you do not  follow your health care provider's instructions, your procedure may be delayed or canceled. Medicines Ask your health care provider about: Changing or stopping your regular medicines. This is especially important if you are taking diabetes medicines or blood thinners. Taking medicines such as aspirin and ibuprofen. These medicines can thin your blood. Do not take these medicines unless your health care provider tells you to take them. Taking over-the-counter medicines, vitamins, herbs, and supplements. Surgery safety Ask your health care provider: How your surgery site will be marked. What steps will be taken to help prevent infection. These steps may include: Removing hair at the surgery site. Washing skin with a germ-killing soap. Taking antibiotic medicine. General instructions Do not use any products that contain nicotine or tobacco for at least 4 weeks before the procedure. These products include cigarettes, chewing tobacco, and vaping devices, such as e-cigarettes. If you need help quitting, ask your health care provider. A complete history and physical exam will be done. You will be asked about any previous injury (trauma) or infections of your genitals. If you will be going home right after the procedure, plan to have a responsible adult: Take you home from the hospital or clinic. You will not be allowed to drive. Care for you for the time you are told. What happens during the procedure? An IV will be inserted into one of your veins. You will be given one or more of the following: A medicine to help you relax (sedative). A medicine to make you fall asleep (general anesthetic). An incision will be made in your scrotum. Your testicle and epididymis will be pulled through the incision. The epididymis will be separated from your   testicle and removed. Your testicle will be put back into your scrotum. A small drain may be placed in your incision. The drain allows extra blood or fluid to  flow out of your scrotum after the procedure. The incision will be closed with stitches (sutures). The procedure may vary among health care providers and hospitals. What happens after the procedure? Your blood pressure, heart rate, breathing rate, and blood oxygen level will be monitored until you leave the hospital or clinic. If you have a drain, you may need to stay in the hospital until the drain is removed. If you were given a sedative during the procedure, it can affect you for several hours. Do not drive or operate machinery until your health care provider says that it is safe. Summary The goal of an epididymectomy is to relieve long-term (chronic) pain in a testicle when the pain cannot be controlled by medicine. Follow instructions from your health care provider about what you may eat and drink before your procedure. You may have a drain after this procedure. If you have a drain, you may need to stay in the hospital until the drain is removed. After the procedure, plan to have a responsible adult care for you for the time you are told. This information is not intended to replace advice given to you by your health care provider. Make sure you discuss any questions you have with your health care provider. Document Revised: 08/31/2020 Document Reviewed: 08/31/2020 Elsevier Patient Education  2023 [employer]  

## 2021-09-18 ENCOUNTER — Encounter: Payer: Self-pay | Admitting: Neurology

## 2021-09-18 ENCOUNTER — Ambulatory Visit (INDEPENDENT_AMBULATORY_CARE_PROVIDER_SITE_OTHER): Payer: BC Managed Care – PPO | Admitting: Neurology

## 2021-09-18 VITALS — BP 93/64 | HR 72 | Ht 67.0 in | Wt 179.0 lb

## 2021-09-18 DIAGNOSIS — G500 Trigeminal neuralgia: Secondary | ICD-10-CM

## 2021-09-18 MED ORDER — CARBAMAZEPINE 200 MG PO TABS: 200.0000 mg | ORAL_TABLET | Freq: Two times a day (BID) | ORAL | 0 refills | Status: AC

## 2021-09-18 NOTE — Patient Instructions (Signed)
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 Patient to contact us in a month for updates  Return in 3 months or sooner if worse

## 2021-09-18 NOTE — Progress Notes (Signed)
[facility]  PATIENT: Darius Young DOB: Jul 04, 1970  REQUESTING CLINICIAN: Skotnicki, Meghan A, DO HISTORY FROM: Patient  REASON FOR VISIT: Right facial (V2 distribution numbness)    HISTORICAL  CHIEF COMPLAINT:  Chief Complaint  Patient presents with   New Patient (Initial Visit)    RM 17 alone. Reports he is here discuss worsening numbness/tingling on the left side of the face. Sx have been ongoing since NOV. Report he was rx'd an anitbotic and steriod pack     HISTORY OF PRESENT ILLNESS:  This is a 51 year old gentleman with past medical history of seasonal allergies who is presenting with 74-months history of left facial, V2 distribution numbness, no pain. Patient reports numbness started suddenly, denies any pain, but states sometimes it feels like burning sensation and sometimes feels that he is gingiva very swollen.  He did follow-up with dentist and was told that everything was normal.  He did follow-up with a neurosurgeon and was told that everything was normal.  And lastly he follow-up with the ENT, again work-up has been unrevealing, he did have MRI brain and orbit which was also normal. He denies any trauma, denies any fall, GU denies any change of skin, denies any hearing loss no tinnitus no other complaints. He reports recent inguinal surgery    OTHER MEDICAL CONDITIONS: Seasonal allergies    REVIEW OF SYSTEMS: Full 14 system review of systems performed and negative with exception of: As noted in the HPI   ALLERGIES: No Known Allergies  HOME MEDICATIONS: Outpatient Medications Prior to Visit  Medication Sig Dispense Refill   cetirizine (ZYRTEC) 10 MG tablet Take 10 mg by mouth daily as needed for allergies.     fluticasone (FLONASE) 50 MCG/ACT nasal spray Place 1 spray into both nostrils daily as needed for allergies or rhinitis.     ibuprofen (ADVIL) 800 MG tablet Take 800 mg by mouth every 8 (eight) hours as needed for moderate pain.      meloxicam (MOBIC) 15 MG tablet Take 15 mg by mouth daily.     meloxicam (MOBIC) 7.5 MG tablet Take 1 tablet by mouth once daily 30 tablet 0   oxyCODONE (ROXICODONE) 5 MG immediate release tablet Take 1 tablet (5 mg total) by mouth every 4 (four) hours as needed for severe pain. (Patient not taking: Reported on 09/04/2021) 30 tablet 0   sulfamethoxazole-trimethoprim (BACTRIM DS) 800-160 MG tablet Take 1 tablet by mouth 2 (two) times daily. (Patient not taking: Reported on 09/04/2021) 28 tablet 0   No facility-administered medications prior to visit.    PAST MEDICAL HISTORY: Past Medical History:  Diagnosis Date   Back pain     PAST SURGICAL HISTORY: Past Surgical History:  Procedure Laterality Date   FOOT SURGERY Right    INGUINAL HERNIA REPAIR Right 07/21/2021   Procedure: HERNIA REPAIR INGUINAL ADULT WITH MESH;  Surgeon: Franky Macho, MD;  Location: [facility];  Service: General;  Laterality: Right;    FAMILY HISTORY: No family history on file.  SOCIAL HISTORY: Social History   Socioeconomic History   Marital status: Married    Spouse name: Not on file   Number of children: Not on file   Years of education: Not on file   Highest education level: Not on file  Occupational History   Not on file  Tobacco Use   Smoking status: Never   Smokeless tobacco: Former    Types: Chew  Vaping Use   Vaping Use: Never used  Substance and  Sexual Activity   Alcohol use: Not Currently    Comment: Social   Drug use: Never   Sexual activity: Not on file  Other Topics Concern   Not on file  Social History Narrative   Right handed    Caffeine less than 1/2 cup.     Social Determinants of Health   Financial Resource Strain: Not on file  Food Insecurity: Not on file  Transportation Needs: Not on file  Physical Activity: Not on file  Stress: Not on file  Social Connections: Not on file  Intimate Partner Violence: Not on file    PHYSICAL EXAM  GENERAL EXAM/CONSTITUTIONAL: Vitals:   Vitals:   09/18/21 0831  BP: 93/64  Pulse: 72  Weight: 179 lb (81.2 kg)  Height: 5\' 7"  (1.702 m)   Body mass index is 28.04 kg/m. Wt Readings from Last 3 Encounters:  09/18/21 179 lb (81.2 kg)  07/21/21 170 lb (77.1 kg)  07/19/21 170 lb (77.1 kg)   Patient is in no distress; well developed, nourished and groomed; neck is supple  EYES: Pupils round and reactive to light, Visual fields full to confrontation, Extraocular movements intacts,   MUSCULOSKELETAL: Gait, strength, tone, movements noted in Neurologic exam below  NEUROLOGIC: MENTAL STATUS:      No data to display         awake, alert, oriented to person, place and time recent and remote memory intact normal attention and concentration language fluent, comprehension intact, naming intact fund of knowledge appropriate  CRANIAL NERVE:  2nd, 3rd, 4th, 6th - pupils equal and reactive to light, visual fields full to confrontation, extraocular muscles intact, no nystagmus 5th - Reports mild decrease sensation to pinprick to left V2 distribution 7th - facial strength symmetric 8th - hearing intact 9th - palate elevates symmetrically, uvula midline 11th - shoulder shrug symmetric 12th - tongue protrusion midline  MOTOR:  normal bulk and tone, full strength in the BUE, BLE  SENSORY:  normal and symmetric to light touch to BUE and BLE   COORDINATION:  finger-nose-finger, fine finger movements normal  REFLEXES:  deep tendon reflexes present and symmetric  GAIT/STATION:  normal   DIAGNOSTIC DATA (LABS, IMAGING, TESTING) - I reviewed patient records, labs, notes, testing and imaging myself where available.  No results found for: "WBC", "HGB", "HCT", "MCV", "PLT" No results found for: "NA", "K", "CL", "CO2", "GLUCOSE", "BUN", "CREATININE", "CALCIUM", "PROT", "ALBUMIN", "AST", "ALT", "ALKPHOS", "BILITOT", "GFRNONAA", "GFRAA" No results found for: "CHOL", "HDL", "LDLCALC", "LDLDIRECT", "TRIG", "CHOLHDL" No  results found for: "HGBA1C" No results found for: "VITAMINB12" No results found for: "TSH"  MRI Brain and Orbits 09/04/21: No acute intraparenchymal abnormalities.     ASSESSMENT AND PLAN  51 y.o. year old male with history of seasonal allergy and recent inguinal hernia repair who is presenting with 67-months history of left facial, V2 distribution numbness, denies any pain.  He will sometimes experience burning sensation and his left gingiva feels swollen.  On exam today he has mild decreased pinprick on the left V2 area but otherwise normal exam.  He denies any pain, this is atypical for trigeminal neuralgia but still a consideration.  I will start by getting a MRA of the head to rule out compression of the trigeminal nerve.  I will also give him a trial of carbamazepine 200 mg twice daily.  Patient to contact 6-month in a month and update [place].  Follow-up in 3 months.   1. 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 Patient to contact us in a month for updates  Return in 3 months or sooner if worse   Orders Placed This Encounter  Procedures   MR ANGIO HEAD WO CONTRAST    Meds ordered this encounter  Medications   carbamazepine (TEGRETOL) 200 MG tablet    Sig: Take 1 tablet (200 mg total) by mouth in the morning and at bedtime.    Dispense:  60 tablet    Refill:  0    Return in about 3 months (around 12/19/2021).  I have spent a total of 50 minutes dedicated to this patient today, preparing to see patient, performing a medically appropriate examination and evaluation, ordering tests and/or medications and procedures, and counseling and educating the patient/family/caregiver; independently interpreting result and communicating results to the family/patient/caregiver; and documenting clinical information in the  electronic medical record.   Windell Norfolk, MD 09/18/2021, 2:50 PM  [facility] [address], [address] [place], [place] [address] [phone]

## 2021-09-25 ENCOUNTER — Telehealth: Payer: Self-pay | Admitting: Neurology

## 2021-09-25 NOTE — Telephone Encounter (Signed)
[employer] Berkley Harvey: [phone]  09/25/2021 - 11/23/2021 sent to GI per [facility]

## 2021-09-29 ENCOUNTER — Ambulatory Visit
Admission: RE | Admit: 2021-09-29 | Discharge: 2021-09-29 | Disposition: A | Discharge status: Home / Self Care | Payer: BC Managed Care – PPO | Source: Ambulatory Visit | Attending: Neurology | Admitting: Neurology

## 2021-09-29 ENCOUNTER — Other Ambulatory Visit: Payer: BC Managed Care – PPO

## 2021-09-29 DIAGNOSIS — G500 Trigeminal neuralgia: Secondary | ICD-10-CM

## 2021-10-03 ENCOUNTER — Encounter (HOSPITAL_COMMUNITY)
Admission: RE | Admit: 2021-10-03 | Discharge: 2021-10-03 | Disposition: A | Discharge status: Home / Self Care | Payer: BC Managed Care – PPO | Source: Ambulatory Visit | Attending: Urology | Admitting: Urology

## 2021-10-03 ENCOUNTER — Other Ambulatory Visit: Payer: Self-pay

## 2021-10-03 ENCOUNTER — Encounter (HOSPITAL_COMMUNITY): Payer: Self-pay

## 2021-10-05 ENCOUNTER — Encounter (HOSPITAL_COMMUNITY)
Admission: RE | Disposition: A | Discharge status: Home / Self Care | Payer: Self-pay | Source: Home / Self Care | Attending: Urology

## 2021-10-05 ENCOUNTER — Other Ambulatory Visit: Payer: Self-pay

## 2021-10-05 ENCOUNTER — Ambulatory Visit (HOSPITAL_COMMUNITY): Payer: BC Managed Care – PPO | Admitting: Anesthesiology

## 2021-10-05 ENCOUNTER — Encounter (HOSPITAL_COMMUNITY): Payer: Self-pay | Admitting: Urology

## 2021-10-05 ENCOUNTER — Ambulatory Visit (HOSPITAL_COMMUNITY)
Admission: RE | Admit: 2021-10-05 | Discharge: 2021-10-05 | Disposition: A | Discharge status: Home / Self Care | Payer: BC Managed Care – PPO | Attending: Urology | Admitting: Urology

## 2021-10-05 DIAGNOSIS — N433 Hydrocele, unspecified: Secondary | ICD-10-CM | POA: Insufficient documentation

## 2021-10-05 DIAGNOSIS — N5089 Other specified disorders of the male genital organs: Secondary | ICD-10-CM | POA: Diagnosis not present

## 2021-10-05 HISTORY — PX: EPIDIDYMECTOMY: SHX6275

## 2021-10-05 SURGERY — EPIDIDYMECTOMY
Anesthesia: General | Site: Groin | Laterality: Right

## 2021-10-05 MED ORDER — PROPOFOL 10 MG/ML IV BOLUS
INTRAVENOUS | Status: DC | PRN
  Administered 2021-10-05: 200 mg via INTRAVENOUS

## 2021-10-05 MED ORDER — LACTATED RINGERS IV SOLN
INTRAVENOUS | Status: DC
Start: 2021-10-05 — End: 2021-10-05

## 2021-10-05 MED ORDER — ORAL CARE MOUTH RINSE
15.0000 mL | Freq: Once | OROMUCOSAL | Status: AC
Start: 2021-10-05 — End: 2021-10-05

## 2021-10-05 MED ORDER — FENTANYL CITRATE (PF) 100 MCG/2ML IJ SOLN
INTRAMUSCULAR | Status: DC | PRN
  Administered 2021-10-05: 100 ug via INTRAVENOUS

## 2021-10-05 MED ORDER — LACTATED RINGERS IV SOLN: INTRAVENOUS | Status: DC | PRN

## 2021-10-05 MED ORDER — LIDOCAINE 2% (20 MG/ML) 5 ML SYRINGE
INTRAMUSCULAR | Status: DC | PRN
  Administered 2021-10-05: 100 mg via INTRAVENOUS

## 2021-10-05 MED ORDER — ONDANSETRON HCL 4 MG/2ML IJ SOLN: 4.0000 mg | Freq: Once | INTRAMUSCULAR | Status: DC | PRN

## 2021-10-05 MED ORDER — DEXAMETHASONE SODIUM PHOSPHATE 10 MG/ML IJ SOLN
INTRAMUSCULAR | Status: AC
  Filled 2021-10-05: qty 1

## 2021-10-05 MED ORDER — OXYCODONE HCL 5 MG PO TABS
5.0000 mg | ORAL_TABLET | Freq: Once | ORAL | Status: AC | PRN
  Administered 2021-10-05: 5 mg via ORAL
  Filled 2021-10-05: qty 1

## 2021-10-05 MED ORDER — CEFAZOLIN SODIUM-DEXTROSE 2-4 GM/100ML-% IV SOLN
2.0000 g | INTRAVENOUS | Status: DC
  Filled 2021-10-05: qty 100

## 2021-10-05 MED ORDER — 0.9 % SODIUM CHLORIDE (POUR BTL) OPTIME
TOPICAL | Status: DC | PRN
  Administered 2021-10-05: 1000 mL

## 2021-10-05 MED ORDER — OXYCODONE HCL 5 MG/5ML PO SOLN: 5.0000 mg | Freq: Once | ORAL | Status: AC | PRN

## 2021-10-05 MED ORDER — LIDOCAINE HCL (PF) 2 % IJ SOLN
INTRAMUSCULAR | Status: AC
  Filled 2021-10-05: qty 5

## 2021-10-05 MED ORDER — CEFAZOLIN SODIUM-DEXTROSE 2-3 GM-%(50ML) IV SOLR
INTRAVENOUS | Status: DC | PRN
  Administered 2021-10-05: 2 g via INTRAVENOUS

## 2021-10-05 MED ORDER — MIDAZOLAM HCL 5 MG/5ML IJ SOLN
INTRAMUSCULAR | Status: DC | PRN
  Administered 2021-10-05: 2 mg via INTRAVENOUS

## 2021-10-05 MED ORDER — MIDAZOLAM HCL 2 MG/2ML IJ SOLN
INTRAMUSCULAR | Status: AC
  Filled 2021-10-05: qty 2

## 2021-10-05 MED ORDER — ONDANSETRON HCL 4 MG/2ML IJ SOLN
INTRAMUSCULAR | Status: AC
  Filled 2021-10-05: qty 2

## 2021-10-05 MED ORDER — FENTANYL CITRATE PF 50 MCG/ML IJ SOSY
25.0000 ug | PREFILLED_SYRINGE | INTRAMUSCULAR | Status: DC | PRN
  Administered 2021-10-05: 50 ug via INTRAVENOUS
  Filled 2021-10-05: qty 1

## 2021-10-05 MED ORDER — OXYCODONE-ACETAMINOPHEN 5-325 MG PO TABS: 1.0000 | ORAL_TABLET | ORAL | 0 refills | Status: AC | PRN

## 2021-10-05 MED ORDER — FENTANYL CITRATE (PF) 250 MCG/5ML IJ SOLN
INTRAMUSCULAR | Status: AC
  Filled 2021-10-05: qty 5

## 2021-10-05 MED ORDER — BUPIVACAINE HCL 0.25 % IJ SOLN
INTRAMUSCULAR | Status: DC | PRN
  Administered 2021-10-05: 10 mL

## 2021-10-05 MED ORDER — BUPIVACAINE HCL (PF) 0.25 % IJ SOLN
INTRAMUSCULAR | Status: AC
  Filled 2021-10-05: qty 30

## 2021-10-05 MED ORDER — CHLORHEXIDINE GLUCONATE 0.12 % MT SOLN
15.0000 mL | Freq: Once | OROMUCOSAL | Status: AC
Start: 2021-10-05 — End: 2021-10-05
  Administered 2021-10-05: 15 mL via OROMUCOSAL

## 2021-10-05 MED ORDER — FENTANYL CITRATE (PF) 100 MCG/2ML IJ SOLN
INTRAMUSCULAR | Status: AC
  Filled 2021-10-05: qty 2

## 2021-10-05 SURGICAL SUPPLY — 34 items
ADH SKN CLS APL DERMABOND .7 (GAUZE/BANDAGES/DRESSINGS) ×1
COVER SURGICAL LIGHT HANDLE (MISCELLANEOUS) ×2 IMPLANT
DERMABOND ADVANCED (GAUZE/BANDAGES/DRESSINGS) ×1
DERMABOND ADVANCED .7 DNX12 (GAUZE/BANDAGES/DRESSINGS) ×1 IMPLANT
DRSG TEGADERM 4X4.75 (GAUZE/BANDAGES/DRESSINGS) ×1 IMPLANT
ELECT NDL BLADE 2-5/6 (NEEDLE) ×1 IMPLANT
ELECT NEEDLE BLADE 2-5/6 (NEEDLE) ×1 IMPLANT
ELECT REM PT RETURN 9FT ADLT (ELECTROSURGICAL) ×1
ELECTRODE REM PT RTRN 9FT ADLT (ELECTROSURGICAL) ×1 IMPLANT
GAUZE 4X4 16PLY ~~LOC~~+RFID DBL (SPONGE) ×1 IMPLANT
GAUZE KERLIX 2  STERILE LF (GAUZE/BANDAGES/DRESSINGS) IMPLANT
GAUZE KERLIX 2X3 DERM STRL LF (GAUZE/BANDAGES/DRESSINGS) ×1 IMPLANT
GAUZE SPONGE 4X4 12PLY STRL (GAUZE/BANDAGES/DRESSINGS) IMPLANT
GLOVE BIO SURGEON STRL SZ8 (GLOVE) ×1 IMPLANT
GLOVE BIOGEL PI IND STRL 7.0 (GLOVE) ×2 IMPLANT
GLOVE BIOGEL PI IND STRL 8 (GLOVE) ×1 IMPLANT
GLOVE ECLIPSE 6.5 STRL STRAW (GLOVE) IMPLANT
GOWN STRL REUS W/TWL LRG LVL3 (GOWN DISPOSABLE) ×1 IMPLANT
GOWN STRL REUS W/TWL XL LVL3 (GOWN DISPOSABLE) ×1 IMPLANT
KIT TURNOVER KIT A (KITS) ×1 IMPLANT
MANIFOLD NEPTUNE II (INSTRUMENTS) ×1 IMPLANT
NDL HYPO 25X1 1.5 SAFETY (NEEDLE) ×1 IMPLANT
NEEDLE HYPO 25X1 1.5 SAFETY (NEEDLE) ×1 IMPLANT
NS IRRIG 1000ML POUR BTL (IV SOLUTION) ×1 IMPLANT
PACK MINOR (CUSTOM PROCEDURE TRAY) ×1 IMPLANT
PAD ARMBOARD 7.5X6 YLW CONV (MISCELLANEOUS) ×1 IMPLANT
SET BASIN LINEN APH (SET/KITS/TRAYS/PACK) ×1 IMPLANT
SUPPORT SCROTAL LG STRP (MISCELLANEOUS) ×1 IMPLANT
SUT MNCRL AB 4-0 PS2 18 (SUTURE) IMPLANT
SUT SILK 3 0 (SUTURE) ×1
SUT SILK 3-0 18XBRD TIE 12 (SUTURE) IMPLANT
SUT VIC AB 3-0 SH 27 (SUTURE) ×1
SUT VIC AB 3-0 SH 27X BRD (SUTURE) ×1 IMPLANT
SYR CONTROL 10ML LL (SYRINGE) ×1 IMPLANT

## 2021-10-05 NOTE — Anesthesia Procedure Notes (Signed)
Procedure Name: LMA Insertion Date/Time: 10/05/2021 3:06 PM  Performed by: Windell Norfolk, MDPre-anesthesia Checklist: Patient identified, Emergency Drugs available, Suction available, Patient being monitored and Timeout performed Patient Re-evaluated:Patient Re-evaluated prior to induction Oxygen Delivery Method: Circle system utilized Preoxygenation: Pre-oxygenation with 100% oxygen Induction Type: IV induction LMA: LMA inserted LMA Size: 4.0 Dental Injury: Teeth and Oropharynx as per pre-operative assessment

## 2021-10-05 NOTE — Anesthesia Preprocedure Evaluation (Signed)
Anesthesia Evaluation  Patient identified by MRN, date of birth, ID band Patient awake    Reviewed: Allergy & Precautions, H&P , NPO status , Patient's Chart, lab work & pertinent test results, reviewed documented beta blocker date and time   Airway Mallampati: II  TM Distance: >3 FB Neck ROM: full    Dental no notable dental hx.    Pulmonary neg pulmonary ROS,    Pulmonary exam normal breath sounds clear to auscultation       Cardiovascular Exercise Tolerance: Good negative cardio ROS   Rhythm:regular Rate:Normal     Neuro/Psych negative neurological ROS  negative psych ROS   GI/Hepatic negative GI ROS, Neg liver ROS,   Endo/Other  negative endocrine ROS  Renal/GU negative Renal ROS  negative genitourinary   Musculoskeletal   Abdominal   Peds  Hematology negative hematology ROS (+)   Anesthesia Other Findings   Reproductive/Obstetrics negative OB ROS                             Anesthesia Physical Anesthesia Plan  ASA: 1  Anesthesia Plan: General and General LMA   Post-op Pain Management:    Induction:   PONV Risk Score and Plan: Ondansetron  Airway Management Planned:   Additional Equipment:   Intra-op Plan:   Post-operative Plan:   Informed Consent: I have reviewed the patients History and Physical, chart, labs and discussed the procedure including the risks, benefits and alternatives for the proposed anesthesia with the patient or authorized representative who has indicated his/her understanding and acceptance.     Dental Advisory Given  Plan Discussed with: CRNA  Anesthesia Plan Comments:         Anesthesia Quick Evaluation  

## 2021-10-05 NOTE — Anesthesia Postprocedure Evaluation (Signed)
Anesthesia Post Note  Patient: Darius Young  Procedure(s) Performed: EPIDIDYMECTOMY (Right: Groin)  Patient location during evaluation: PACU Anesthesia Type: General Level of consciousness: awake and alert Pain management: pain level controlled Vital Signs Assessment: post-procedure vital signs reviewed and stable Respiratory status: spontaneous breathing, nonlabored ventilation, respiratory function stable and patient connected to nasal cannula oxygen Cardiovascular status: blood pressure returned to baseline and stable Postop Assessment: no apparent nausea or vomiting Anesthetic complications: no   No notable events documented.   Last Vitals:  Vitals:   10/05/21 1220  BP: 117/76  Pulse: 63  Resp: 18  Temp: 36.7 C  SpO2: 97%    Last Pain:  Vitals:   10/05/21 1220  TempSrc: Oral  PainSc: 0-No pain                 Windell Norfolk

## 2021-10-05 NOTE — Transfer of Care (Signed)
Immediate Anesthesia Transfer of Care Note  Patient: Darius Young  Procedure(s) Performed: EPIDIDYMECTOMY (Right: Groin)  Patient Location: PACU  Anesthesia Type:General  Level of Consciousness: drowsy  Airway & Oxygen Therapy: Patient Spontanous Breathing  Post-op Assessment: Report given to RN and Post -op Vital signs reviewed and stable  Post vital signs: Reviewed and stable  Last Vitals:  Vitals Value Taken Time  BP 94/57   Temp 98   Pulse 66 10/05/21 1552  Resp 15 10/05/21 1552  SpO2 90 % 10/05/21 1552  Vitals shown include unvalidated device data.  Last Pain:  Vitals:   10/05/21 1220  TempSrc: Oral  PainSc: 0-No pain      Patients Stated Pain Goal: 6 (10/05/21 1220)  Complications: No notable events documented.

## 2021-10-05 NOTE — Op Note (Signed)
Preoperative diagnosis: right epididymal pain   Postoperative diagnosis: right epididymal pain, right hydrocele   Procedure: 1. right epididymectomy 2. right hydrocelectomy   Attending: Wilkie Aye, MD   Anesthesia: General   History of blood loss: Minimal   Antibiotics: ancef   Drains: none   Specimens: 1. Right epididymis   Findings: multiple right epididymal cysts   Indications: Patient is a 51 year old male with a history of right epididymis which was painful and causing him pain with walking.  We discussed the treatment options including observation versus excision after discussing treatment options he decided to proceed with excision.    Procedure in detail: Prior to procedure consent was obtained.  Patient was brought to the operating room and a brief timeout was done to ensure correct patient, correct procedure, correct site.  General anesthesia was administered and patient was placed in supine position.  His genitalia was then prepped and draped in usual sterile fashion.  A 3 cm incision was made in the right hemiscrotum.  We dissected down to the tunica and then incised the tunica. A small hydrocele was encountered. We then excised the hydrocele sac and then over sewed the edge with 3-0 Vicryl in a running fashion. We then excised the right appendix testis and sent it for pathology. We then used sharp dissection to excision the right epididymis. This was then sent for pathology. Hemostasis was then obtained with electrocautery. We then closed the defect in the epididymis with 3-0 vicryl in a running fashion. We then returned the testis to the right hemiscrotum and closed the overlying dartos with 3-0 vicryl in a running fashion. The skin was then closed with 4-0 monocryl in a running fashion. Dermabond was placed on the incision.  A dressing was then applied to the incision.  We then placed a scrotal fluff and this then concluded the procedure which was well tolerated by the  patient.   Complications: None   Condition: Stable, extubated, transferred to PACU.   Plan: Patient is to be discharged home.  He is to follow up in 2 weeks for wound check.

## 2021-10-05 NOTE — H&P (Signed)
Urology Admission H&P  Chief Complaint: right epididymal pain  History of Present Illness: Darius Young is a 51yo here for fright epididymectomy for right testis/epididymal pain. The pain is sharp and is related to palpating his epididymis. No other complaints today  Past Medical History:  Diagnosis Date   Back pain    Past Surgical History:  Procedure Laterality Date   FOOT SURGERY Right    INGUINAL HERNIA REPAIR Right 07/21/2021   Procedure: HERNIA REPAIR INGUINAL ADULT WITH MESH;  Surgeon: Franky Macho, MD;  Location: [facility];  Service: General;  Laterality: Right;    Home Medications:  Current Facility-Administered Medications  Medication Dose Route Frequency Provider Last Rate Last Admin   ceFAZolin (ANCEF) IVPB 2g/100 mL premix  2 g Intravenous 30 min Pre-Op Huda Petrey, Mardene Celeste, MD       Allergies: No Known Allergies  No family history on file. Social History:  reports that he has never smoked. He has quit using smokeless tobacco.  His smokeless tobacco use included chew. He reports that he does not currently use alcohol. He reports that he does not use drugs.  Review of Systems  Genitourinary:  Positive for testicular pain.  All other systems reviewed and are negative.   Physical Exam:  Vital signs in last 24 hours: Temp:  [98 F (36.7 C)] 98 F (36.7 C) (08/31 1220) Pulse Rate:  [63] 63 (08/31 1220) Resp:  [18] 18 (08/31 1220) BP: (117)/(76) 117/76 (08/31 1220) SpO2:  [97 %] 97 % (08/31 1220) Physical Exam Constitutional:      Appearance: Normal appearance.  HENT:     Head: Normocephalic and atraumatic.     Mouth/Throat:     Mouth: Mucous membranes are dry.  Eyes:     Extraocular Movements: Extraocular movements intact.     Pupils: Pupils are equal, round, and reactive to light.  Cardiovascular:     Rate and Rhythm: Normal rate and regular rhythm.  Pulmonary:     Effort: Pulmonary effort is normal. No respiratory distress.  Abdominal:     General: Abdomen is  flat. There is no distension.  Musculoskeletal:        General: No swelling. Normal range of motion.     Cervical back: Normal range of motion and neck supple.  Skin:    General: Skin is warm and dry.  Neurological:     General: No focal deficit present.     Mental Status: He is alert and oriented to person, place, and time.  Psychiatric:        Mood and Affect: Mood normal.        Behavior: Behavior normal.        Thought Content: Thought content normal.        Judgment: Judgment normal.     Laboratory Data:  No results found for this or any previous visit (from the past 24 hour(s)). No results found for this or any previous visit (from the past 240 hour(s)). Creatinine: No results for input(s): "CREATININE" in the last 168 hours. Baseline Creatinine: unknown  Impression/Assessment:  51yo with right epididymal pain  Plan:  We discussed epididymectomy and after discussing the options the patient elects for right epididymectomy. Risks/benefits/alternatives discussed  Wilkie Aye 10/05/2021, 1:30 PM

## 2021-10-10 LAB — SURGICAL PATHOLOGY

## 2021-10-11 ENCOUNTER — Encounter: Payer: Self-pay | Admitting: Urology

## 2021-10-11 ENCOUNTER — Ambulatory Visit (INDEPENDENT_AMBULATORY_CARE_PROVIDER_SITE_OTHER): Payer: BC Managed Care – PPO | Admitting: Urology

## 2021-10-11 VITALS — BP 129/80 | HR 79

## 2021-10-11 DIAGNOSIS — N433 Hydrocele, unspecified: Secondary | ICD-10-CM

## 2021-10-11 DIAGNOSIS — Z87438 Personal history of other diseases of male genital organs: Secondary | ICD-10-CM

## 2021-10-11 NOTE — Progress Notes (Signed)
10/11/2021 11:32 AM   Gillis Ends [birth year] [id number]  Referring provider: Lianne Moris, PA-C [address] [place],  [place] [address]  Followup right epididymenctomy  HPI: Mr Bartoli is a 51yo here for followup after right epididymectomy. Mild incisional pain. Testicular pain resolved.    PMH: Past Medical History:  Diagnosis Date   Back pain     Surgical History: Past Surgical History:  Procedure Laterality Date   FOOT SURGERY Right    INGUINAL HERNIA REPAIR Right 07/21/2021   Procedure: HERNIA REPAIR INGUINAL ADULT WITH MESH;  Surgeon: Franky Macho, MD;  Location: [facility];  Service: General;  Laterality: Right;    Home Medications:  Allergies as of 10/11/2021   No Known Allergies      Medication List        Accurate as of October 11, 2021 11:32 AM. If you have any questions, ask your nurse or doctor.          carbamazepine 200 MG tablet Commonly known as: TEGRETOL Take 1 tablet (200 mg total) by mouth in the morning and at bedtime.   CENTRUM ADULT PO Take 1 tablet by mouth daily.   cetirizine 10 MG tablet Commonly known as: ZYRTEC Take 10 mg by mouth daily as needed for allergies.   fluticasone 50 MCG/ACT nasal spray Commonly known as: FLONASE Place 1 spray into both nostrils daily.   ibuprofen 800 MG tablet Commonly known as: ADVIL Take 800 mg by mouth every 8 (eight) hours as needed for moderate pain.   oxyCODONE-acetaminophen 5-325 MG tablet Commonly known as: Percocet Take 1 tablet by mouth every 4 (four) hours as needed for severe pain.        Allergies: No Known Allergies  Family History: No family history on file.  Social History:  reports that he has never smoked. He has quit using smokeless tobacco.  His smokeless tobacco use included chew. He reports that he does not currently use alcohol. He reports that he does not use drugs.  ROS: All other review of systems were reviewed and are negative except what is noted above in  HPI  Physical Exam: BP 129/80   Pulse 79   Constitutional:  Alert and oriented, No acute distress. HEENT: [place] AT, moist mucus membranes.  Trachea midline, no masses. Cardiovascular: No clubbing, cyanosis, or edema. Respiratory: Normal respiratory effort, no increased work of breathing. GI: Abdomen is soft, nontender, nondistended, no abdominal masses GU: No CVA tenderness. Circumcised phallus. No masses/lesions on penis, testis, scrotum. Prostate 40g smooth no nodules no induration.  Lymph: No cervical or inguinal lymphadenopathy. Skin: No rashes, bruises or suspicious lesions. Neurologic: Grossly intact, no focal deficits, moving all 4 extremities. Psychiatric: Normal mood and affect.  Laboratory Data: No results found for: "WBC", "HGB", "HCT", "MCV", "PLT"  No results found for: "CREATININE"  No results found for: "PSA"  No results found for: "TESTOSTERONE"  No results found for: "HGBA1C"  Urinalysis No results found for: "COLORURINE", "APPEARANCEUR", "LABSPEC", "PHURINE", "GLUCOSEU", "HGBUR", "BILIRUBINUR", "KETONESUR", "PROTEINUR", "UROBILINOGEN", "NITRITE", "LEUKOCYTESUR"  No results found for: "LABMICR", "WBCUA", "RBCUA", "LABEPIT", "MUCUS", "BACTERIA"  Pertinent Imaging:  No results found for this or any previous visit.  No results found for this or any previous visit.  No results found for this or any previous visit.  No results found for this or any previous visit.  No results found for this or any previous visit.  No results found for this or any previous visit.  No results found for  this or any previous visit.  No results found for this or any previous visit.   Assessment & Plan:    1. Hydrocele, unspecified hydrocele type -RTC 3 month  - Urinalysis, Routine w reflex microscopic   No follow-ups on file.  Wilkie Aye, MD  [facility] [place]

## 2021-10-12 ENCOUNTER — Encounter (HOSPITAL_COMMUNITY): Payer: Self-pay | Admitting: Urology

## 2022-03-07 ENCOUNTER — Other Ambulatory Visit: Payer: Self-pay | Admitting: Rehabilitation

## 2022-03-07 DIAGNOSIS — M5412 Radiculopathy, cervical region: Secondary | ICD-10-CM

## 2022-03-22 ENCOUNTER — Ambulatory Visit
Admission: RE | Admit: 2022-03-22 | Discharge: 2022-03-22 | Disposition: A | Discharge status: Home / Self Care | Payer: BC Managed Care – PPO | Source: Ambulatory Visit | Attending: Rehabilitation | Admitting: Rehabilitation

## 2022-03-22 DIAGNOSIS — M5412 Radiculopathy, cervical region: Secondary | ICD-10-CM

## 2022-04-11 ENCOUNTER — Ambulatory Visit: Payer: BC Managed Care – PPO | Admitting: Urology

## 2022-04-11 ENCOUNTER — Encounter: Payer: Self-pay | Admitting: Urology

## 2022-04-11 VITALS — BP 113/75 | HR 83

## 2022-04-11 DIAGNOSIS — N50811 Right testicular pain: Secondary | ICD-10-CM

## 2022-04-11 DIAGNOSIS — R6882 Decreased libido: Secondary | ICD-10-CM | POA: Diagnosis not present

## 2022-04-11 DIAGNOSIS — E291 Testicular hypofunction: Secondary | ICD-10-CM | POA: Diagnosis not present

## 2022-04-11 NOTE — Patient Instructions (Signed)

## 2022-04-11 NOTE — Progress Notes (Signed)
04/11/2022 3:09 PM   Darius Young Sep 30, 1970 [id number]  Referring provider: Lanelle Bal, PA-C [place],  [place] [address]  Decreased libido   HPI:  Mr Darius Young is a 52yo here for followup for right testis pain and with new decreased libido. He was started on lyrica which decreased his libido for the past month. No testosterone labs. He has rare right testis pain that is very mild. He has issues getting a firm erection recently.   PMH: Past Medical History:  Diagnosis Date   Back pain     Surgical History: Past Surgical History:  Procedure Laterality Date   EPIDIDYMECTOMY Right 10/05/2021   Procedure: EPIDIDYMECTOMY;  Surgeon: Cleon Gustin, MD;  Location: [facility];  Service: Urology;  Laterality: Right;   FOOT SURGERY Right    INGUINAL HERNIA REPAIR Right 07/21/2021   Procedure: HERNIA REPAIR INGUINAL ADULT WITH MESH;  Surgeon: Aviva Signs, MD;  Location: [facility];  Service: General;  Laterality: Right;    Home Medications:  Allergies as of 04/11/2022   No Known Allergies      Medication List        Accurate as of April 11, 2022  3:09 PM. If you have any questions, ask your nurse or doctor.          carbamazepine 200 MG tablet Commonly known as: TEGRETOL Take 1 tablet (200 mg total) by mouth in the morning and at bedtime.   CENTRUM ADULT PO Take 1 tablet by mouth daily.   cetirizine 10 MG tablet Commonly known as: ZYRTEC Take 10 mg by mouth daily as needed for allergies.   fluticasone 50 MCG/ACT nasal spray Commonly known as: FLONASE Place 1 spray into both nostrils daily.   ibuprofen 800 MG tablet Commonly known as: ADVIL Take 800 mg by mouth every 8 (eight) hours as needed for moderate pain.   oxyCODONE-acetaminophen 5-325 MG tablet Commonly known as: Percocet Take 1 tablet by mouth every 4 (four) hours as needed for severe pain.        Allergies: No Known Allergies  Family History: No family history on file.  Social History:   reports that he has never smoked. He has quit using smokeless tobacco.  His smokeless tobacco use included chew. He reports that he does not currently use alcohol. He reports that he does not use drugs.  ROS: All other review of systems were reviewed and are negative except what is noted above in HPI  Physical Exam: BP 113/75   Pulse 83   Constitutional:  Alert and oriented, No acute distress. HEENT: [place] AT, moist mucus membranes.  Trachea midline, no masses. Cardiovascular: No clubbing, cyanosis, or edema. Respiratory: Normal respiratory effort, no increased work of breathing. GI: Abdomen is soft, nontender, nondistended, no abdominal masses GU: No CVA tenderness.  Lymph: No cervical or inguinal lymphadenopathy. Skin: No rashes, bruises or suspicious lesions. Neurologic: Grossly intact, no focal deficits, moving all 4 extremities. Psychiatric: Normal mood and affect.  Laboratory Data: No results found for: "WBC", "HGB", "HCT", "MCV", "PLT"  No results found for: "CREATININE"  No results found for: "PSA"  No results found for: "TESTOSTERONE"  No results found for: "HGBA1C"  Urinalysis No results found for: "COLORURINE", "APPEARANCEUR", "LABSPEC", "PHURINE", "GLUCOSEU", "HGBUR", "BILIRUBINUR", "KETONESUR", "PROTEINUR", "UROBILINOGEN", "NITRITE", "LEUKOCYTESUR"  No results found for: "LABMICR", "WBCUA", "RBCUA", "LABEPIT", "MUCUS", "BACTERIA"  Pertinent Imaging:  No results found for this or any previous visit.  No results found for this or any previous visit.  No results found for this or any previous visit.  No results found for this or any previous visit.  No results found for this or any previous visit.  No valid procedures specified. No results found for this or any previous visit.  No results found for this or any previous visit.   Assessment & Plan:    1. Pain in right testicle -resolved  2. Hypogonadism -testosterone labs, will call with results   No  follow-ups on file.  Nicolette Bang, MD  [facility] [place]

## 2022-04-19 ENCOUNTER — Other Ambulatory Visit: Payer: BC Managed Care – PPO

## 2022-05-29 ENCOUNTER — Ambulatory Visit: Payer: BC Managed Care – PPO | Admitting: Urology

## 2022-06-05 ENCOUNTER — Other Ambulatory Visit: Payer: BC Managed Care – PPO

## 2022-06-05 DIAGNOSIS — R6882 Decreased libido: Secondary | ICD-10-CM

## 2022-06-11 LAB — TESTOSTERONE,FREE AND TOTAL
Testosterone, Free: 4.4 pg/mL — ABNORMAL LOW (ref 6.8–21.5)
Testosterone: 557 ng/dL (ref 264–916)

## 2022-06-12 ENCOUNTER — Ambulatory Visit: Payer: BC Managed Care – PPO | Admitting: Urology

## 2022-06-12 ENCOUNTER — Encounter: Payer: Self-pay | Admitting: Urology

## 2022-06-12 VITALS — BP 97/64 | HR 79

## 2022-06-12 DIAGNOSIS — N521 Erectile dysfunction due to diseases classified elsewhere: Secondary | ICD-10-CM | POA: Diagnosis not present

## 2022-06-12 DIAGNOSIS — R6882 Decreased libido: Secondary | ICD-10-CM | POA: Diagnosis not present

## 2022-06-12 MED ORDER — TADALAFIL 20 MG PO TABS: 20.0000 mg | ORAL_TABLET | Freq: Every day | ORAL | 5 refills | Status: AC | PRN

## 2022-06-12 NOTE — Patient Instructions (Signed)
Erectile Dysfunction ?Erectile dysfunction (ED) is the inability to get or keep an erection in order to have sexual intercourse. ED is considered a symptom of an underlying disorder and is not considered a disease. ED may include: ?Inability to get an erection. ?Lack of enough hardness of the erection to allow penetration. ?Loss of erection before sex is finished. ?What are the causes? ?This condition may be caused by: ?Physical causes, such as: ?Artery problems. This may include heart disease, high blood pressure, atherosclerosis, and diabetes. ?Hormonal problems, such as low testosterone. ?Obesity. ?Nerve problems. This may include back or pelvic injuries, multiple sclerosis, Parkinson's disease, spinal cord injury, and stroke. ?Certain medicines, such as: ?Pain relievers. ?Antidepressants. ?Blood pressure medicines and water pills (diuretics). ?Cancer medicines. ?Antihistamines. ?Muscle relaxants. ?Lifestyle factors, such as: ?Use of drugs such as marijuana, cocaine, or opioids. ?Excessive use of alcohol. ?Smoking. ?Lack of physical activity or exercise. ?Psychological causes, such as: ?Anxiety or stress. ?Sadness or depression. ?Exhaustion. ?Fear about sexual performance. ?Guilt. ?What are the signs or symptoms? ?Symptoms of this condition include: ?Inability to get an erection. ?Lack of enough hardness of the erection to allow penetration. ?Loss of the erection before sex is finished. ?Sometimes having normal erections, but with frequent unsatisfactory episodes. ?Low sexual satisfaction in either partner due to erection problems. ?A curved penis occurring with erection. The curve may cause pain, or the penis may be too curved to allow for intercourse. ?Never having nighttime or morning erections. ?How is this diagnosed? ?This condition is often diagnosed by: ?Performing a physical exam to find other diseases or specific problems with the penis. ?Asking you detailed questions about the problem. ?Doing tests,  such as: ?Blood tests to check for diabetes mellitus or high cholesterol, or to measure hormone levels. ?Other tests to check for underlying health conditions. ?An ultrasound exam to check for scarring. ?A test to check blood flow to the penis. ?Doing a sleep study at home to measure nighttime erections. ?How is this treated? ?This condition may be treated by: ?Medicines, such as: ?Medicine taken by mouth to help you achieve an erection (oral medicine). ?Hormone replacement therapy to replace low testosterone levels. ?Medicine that is injected into the penis. Your health care provider may instruct you how to give yourself these injections at home. ?Medicine that is delivered with a short applicator tube. The tube is inserted into the opening at the tip of the penis, which is the opening of the urethra. A tiny pellet of medicine is put in the urethra. The pellet dissolves and enhances erectile function. This is also called MUSE (medicated urethral system for erections) therapy. ?Vacuum pump. This is a pump with a ring on it. The pump and ring are placed on the penis and used to create pressure that helps the penis become erect. ?Penile implant surgery. In this procedure, you may receive: ?An inflatable implant. This consists of cylinders, a pump, and a reservoir. The cylinders can be inflated with a fluid that helps to create an erection, and they can be deflated after intercourse. ?A semi-rigid implant. This consists of two silicone rubber rods. The rods provide some rigidity. They are also flexible, so the penis can both curve downward in its normal position and become straight for sexual intercourse. ?Blood vessel surgery to improve blood flow to the penis. During this procedure, a blood vessel from a different part of the body is placed into the penis to allow blood to flow around (bypass) damaged or blocked blood vessels. ?Lifestyle changes,   such as exercising more, losing weight, and quitting smoking. ?Follow  these instructions at home: ?Medicines ? ?Take over-the-counter and prescription medicines only as told by your health care provider. Do not increase the dosage without first discussing it with your health care provider. ?If you are using self-injections, do injections as directed by your health care provider. Make sure you avoid any veins that are on the surface of the penis. After giving an injection, apply pressure to the injection site for 5 minutes. ?Talk to your health care provider about how to prevent headaches while taking ED medicines. These medicines may cause a sudden headache due to the increase in blood flow in your body. ?General instructions ?Exercise regularly, as directed by your health care provider. Work with your health care provider to lose weight, if needed. ?Do not use any products that contain nicotine or tobacco. These products include cigarettes, chewing tobacco, and vaping devices, such as e-cigarettes. If you need help quitting, ask your health care provider. ?Before using a vacuum pump, read the instructions that come with the pump and discuss any questions with your health care provider. ?Keep all follow-up visits. This is important. ?Contact a health care provider if: ?You feel nauseous. ?You are vomiting. ?You get sudden headaches while taking ED medicines. ?You have any concerns about your sexual health. ?Get help right away if: ?You are taking oral or injectable medicines and you have an erection that lasts longer than 4 hours. If your health care provider is unavailable, go to the nearest emergency room for evaluation. An erection that lasts much longer than 4 hours can result in permanent damage to your penis. ?You have severe pain in your groin or abdomen. ?You develop redness or severe swelling of your penis. ?You have redness spreading at your groin or lower abdomen. ?You are unable to urinate. ?You experience chest pain or a rapid heartbeat (palpitations) after taking oral  medicines. ?These symptoms may represent a serious problem that is an emergency. Do not wait to see if the symptoms will go away. Get medical help right away. Call your local emergency services (911 in the [place] Do not drive yourself to the hospital. ?Summary ?Erectile dysfunction (ED) is the inability to get or keep an erection during sexual intercourse. ?This condition is diagnosed based on a physical exam, your symptoms, and tests to determine the cause. Treatment varies depending on the cause and may include medicines, hormone therapy, surgery, or a vacuum pump. ?You may need follow-up visits to make sure that you are using your medicines or devices correctly. ?Get help right away if you are taking or injecting medicines and you have an erection that lasts longer than 4 hours. ?This information is not intended to replace advice given to you by your health care provider. Make sure you discuss any questions you have with your health care provider. ?Document Revised: 04/20/2020 Document Reviewed: 04/20/2020 ?Elsevier Patient Education ? 2023 [employer] ? ?

## 2022-06-12 NOTE — Progress Notes (Signed)
06/12/2022 2:00 PM   Reston Hocker 1970/08/13 [id number]  Referring provider: Lianne Moris, PA-C [address] [place],  [place] [address]  Erectile dysfunction   HPI: Mr Eckley is a 52yo here for followup for erectile dysfunction and decreased libido. He continue to have issues with libido and issues getting an erection. Testosterone normal. He continues to have issues getting and maintaining an erection. He denies any prior PDE5 therapy.  He is scheduled for cervical spine surgery in June.  PMH: Past Medical History:  Diagnosis Date   Back pain     Surgical History: Past Surgical History:  Procedure Laterality Date   EPIDIDYMECTOMY Right 10/05/2021   Procedure: EPIDIDYMECTOMY;  Surgeon: Malen Gauze, MD;  Location: [facility];  Service: Urology;  Laterality: Right;   FOOT SURGERY Right    INGUINAL HERNIA REPAIR Right 07/21/2021   Procedure: HERNIA REPAIR INGUINAL ADULT WITH MESH;  Surgeon: Franky Macho, MD;  Location: [facility];  Service: General;  Laterality: Right;    Home Medications:  Allergies as of 06/12/2022   No Known Allergies      Medication List        Accurate as of Jun 12, 2022  2:00 PM. If you have any questions, ask your nurse or doctor.          carbamazepine 200 MG tablet Commonly known as: TEGRETOL Take 1 tablet (200 mg total) by mouth in the morning and at bedtime.   CENTRUM ADULT PO Take 1 tablet by mouth daily.   cetirizine 10 MG tablet Commonly known as: ZYRTEC Take 10 mg by mouth daily as needed for allergies.   fluticasone 50 MCG/ACT nasal spray Commonly known as: FLONASE Place 1 spray into both nostrils daily.   ibuprofen 800 MG tablet Commonly known as: ADVIL Take 800 mg by mouth every 8 (eight) hours as needed for moderate pain.   oxyCODONE-acetaminophen 5-325 MG tablet Commonly known as: Percocet Take 1 tablet by mouth every 4 (four) hours as needed for severe pain.        Allergies: No Known Allergies  Family History: No  family history on file.  Social History:  reports that he has never smoked. He has quit using smokeless tobacco.  His smokeless tobacco use included chew. He reports that he does not currently use alcohol. He reports that he does not use drugs.  ROS: All other review of systems were reviewed and are negative except what is noted above in HPI  Physical Exam: BP 97/64   Pulse 79   Constitutional:  Alert and oriented, No acute distress. HEENT: [place] AT, moist mucus membranes.  Trachea midline, no masses. Cardiovascular: No clubbing, cyanosis, or edema. Respiratory: Normal respiratory effort, no increased work of breathing. GI: Abdomen is soft, nontender, nondistended, no abdominal masses GU: No CVA tenderness.  Lymph: No cervical or inguinal lymphadenopathy. Skin: No rashes, bruises or suspicious lesions. Neurologic: Grossly intact, no focal deficits, moving all 4 extremities. Psychiatric: Normal mood and affect.  Laboratory Data: No results found for: "WBC", "HGB", "HCT", "MCV", "PLT"  No results found for: "CREATININE"  No results found for: "PSA"  Lab Results  Component Value Date   TESTOSTERONE 557 06/05/2022    No results found for: "HGBA1C"  Urinalysis No results found for: "COLORURINE", "APPEARANCEUR", "LABSPEC", "PHURINE", "GLUCOSEU", "HGBUR", "BILIRUBINUR", "KETONESUR", "PROTEINUR", "UROBILINOGEN", "NITRITE", "LEUKOCYTESUR"  No results found for: "LABMICR", "WBCUA", "RBCUA", "LABEPIT", "MUCUS", "BACTERIA"  Pertinent Imaging: No results found for this or any previous visit.  No  results found for this or any previous visit.  No results found for this or any previous visit.  No results found for this or any previous visit.  No results found for this or any previous visit.  No valid procedures specified. No results found for this or any previous visit.  No results found for this or any previous visit.   Assessment & Plan:    1. Decreased libido -estradiol  lab today  2. Erectile dysfunction due to diseases classified elsewhere Tadalafil 20mg  prn   No follow-ups on file.  Wilkie Aye, MD  [facility] [place]

## 2022-06-13 LAB — ESTRADIOL: Estradiol: 16.7 pg/mL (ref 7.6–42.6)

## 2022-06-19 ENCOUNTER — Encounter: Payer: Self-pay | Admitting: Urology

## 2022-08-08 ENCOUNTER — Ambulatory Visit: Payer: BC Managed Care – PPO | Admitting: Urology

## 2022-08-08 DIAGNOSIS — N521 Erectile dysfunction due to diseases classified elsewhere: Secondary | ICD-10-CM

## 2022-08-08 DIAGNOSIS — R6882 Decreased libido: Secondary | ICD-10-CM

## 2023-05-28 IMAGING — US US SCROTUM W/ DOPPLER COMPLETE
1 series · 13 of 25 positions shown · non-contrast
Comparison: None

CLINICAL DATA: RIGHT groin and testicular pain increased with
squatting

EXAM:
SCROTAL ULTRASOUND
DOPPLER ULTRASOUND OF THE TESTICLES
TECHNIQUE: Complete ultrasound examination of the testicles, epididymis, and
other scrotal structures was performed. Color and spectral Doppler
ultrasound were also utilized to evaluate blood flow to the
testicles.

[Series 1: us scrotum w/ doppler complete · 0.08mm/px · 65 acquisitions, 13 frames shown]
[im 1/65]
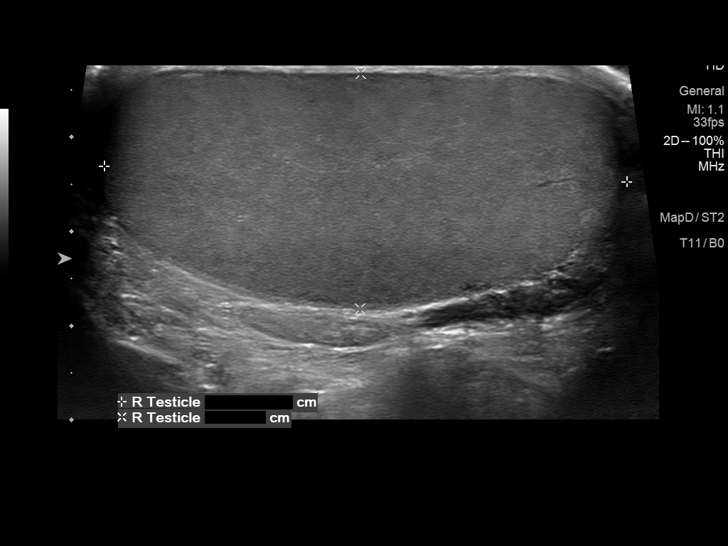
[im 6/65]
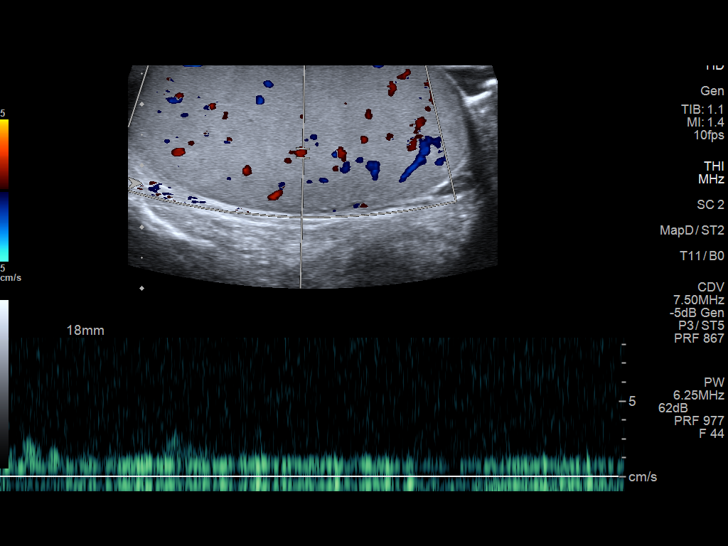
[im 11/65]
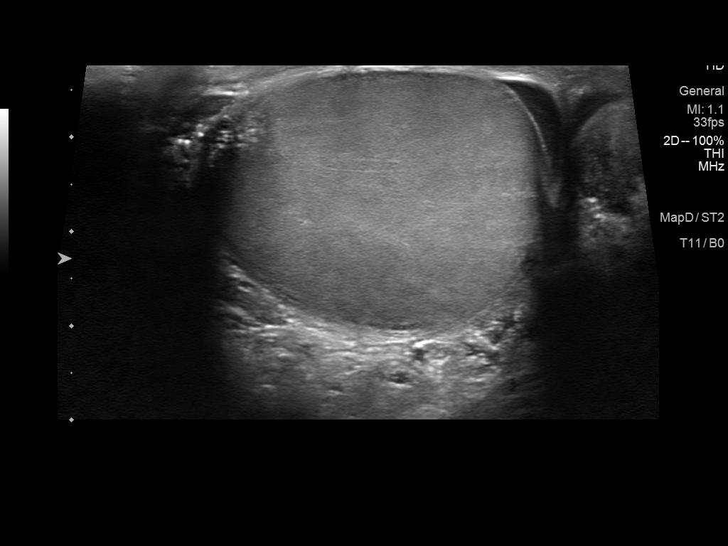
[im 17/65]
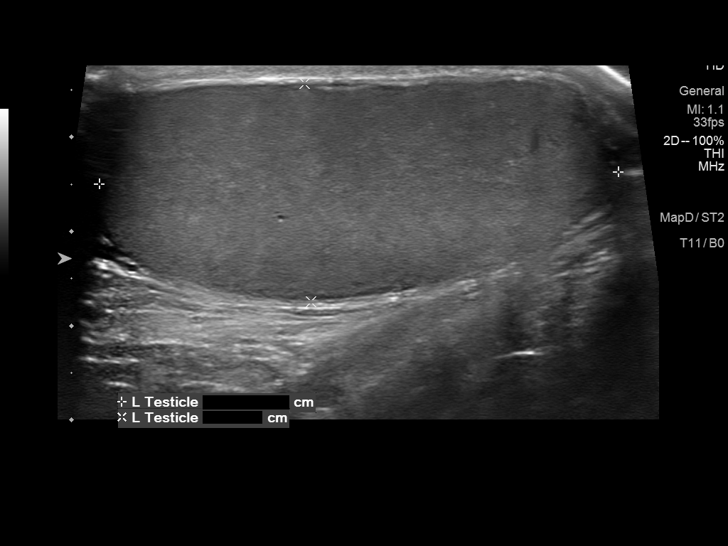
[im 22/65]
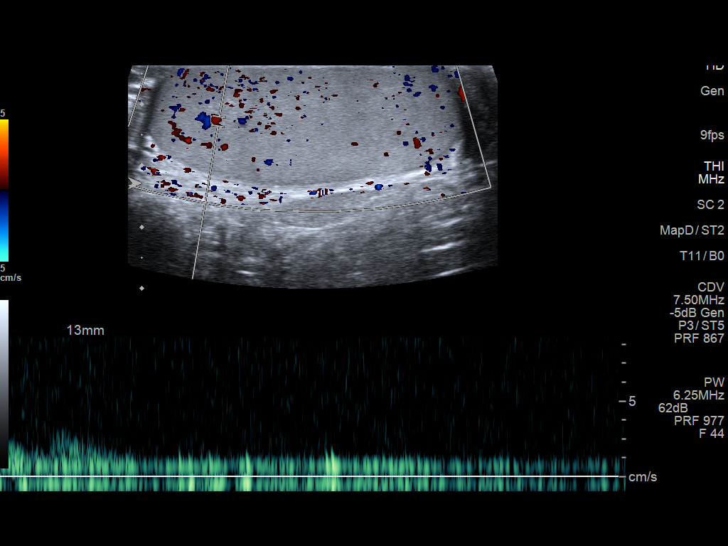
[im 27/65]
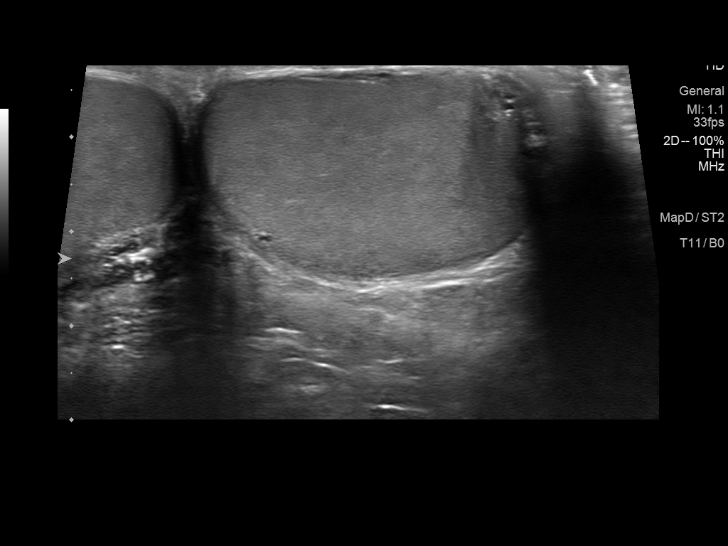
[im 33/65]
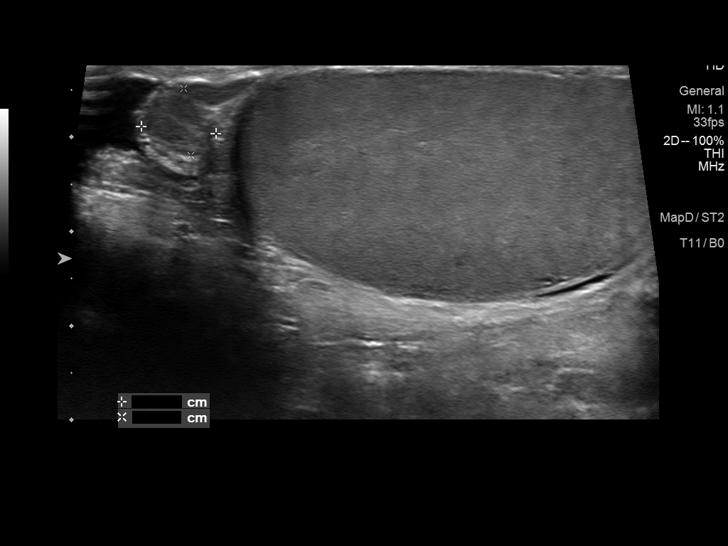
[im 38/65]
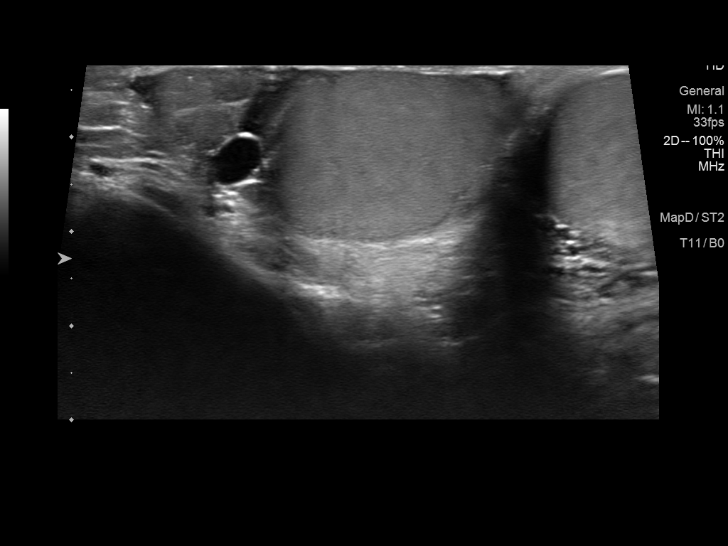
[im 43/65]
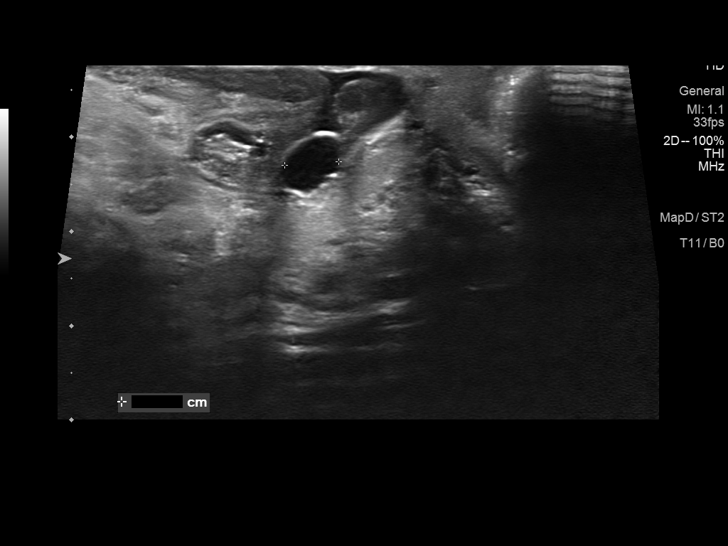
[im 49/65]
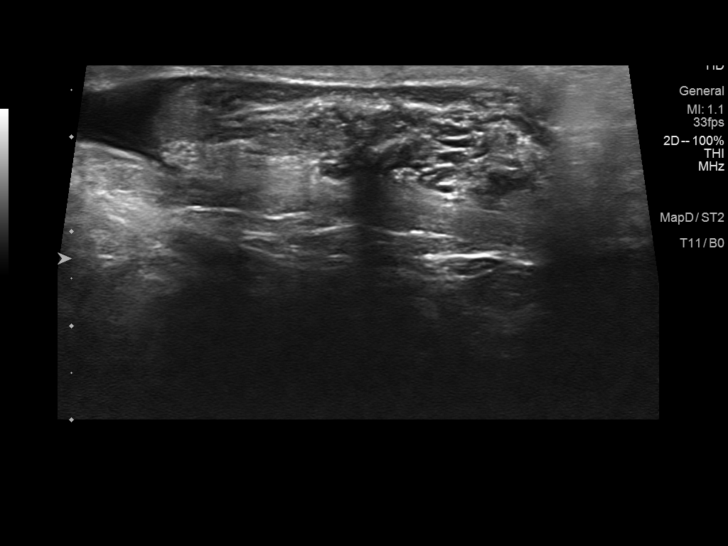
[im 54/65]
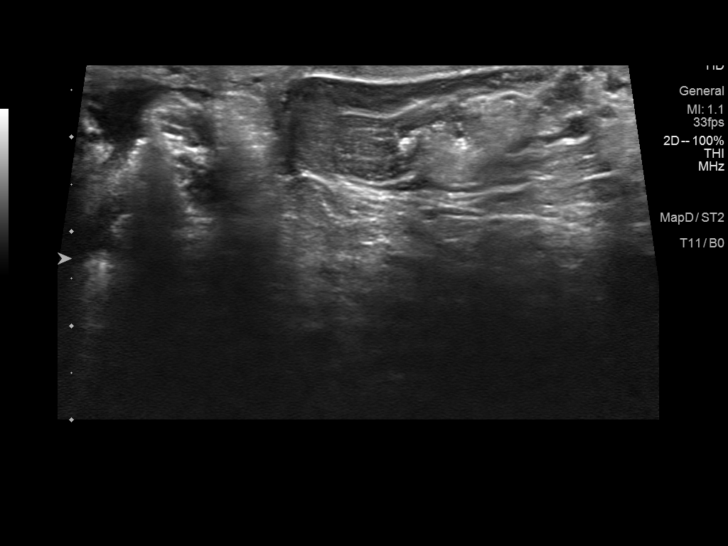
[im 59/65]
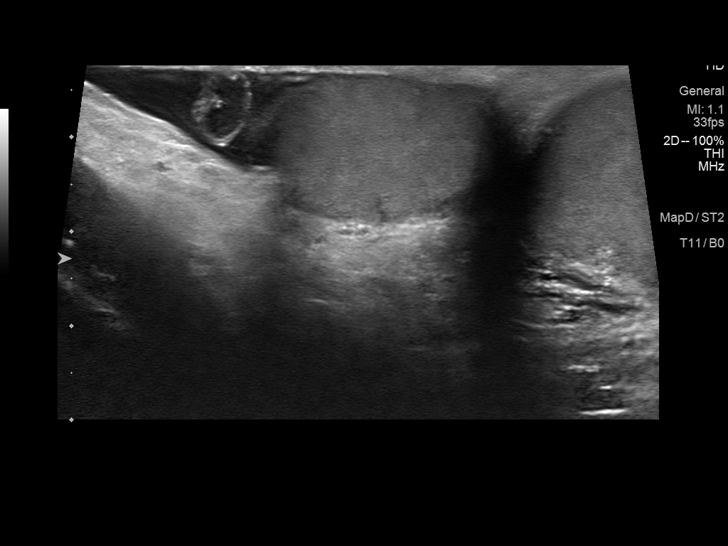
[im 65/65]
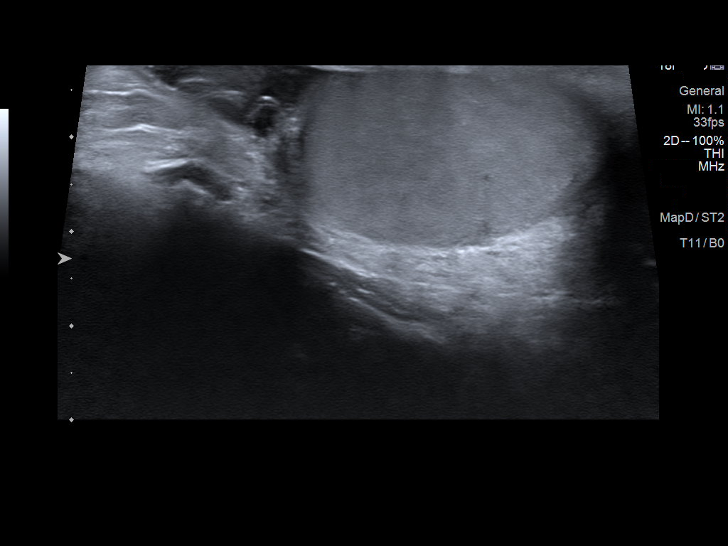

[13 of 25 positions shown; findings below may reference images not displayed]

FINDINGS: Right testicle

Measurements: 5.5 x 2.5 x 3.6 cm. Normal echogenicity without mass
or calcification. Internal blood flow present on color Doppler
imaging.

Left testicle

Measurements: 5.5 x 2.3 x 3.5 cm. Normal echogenicity without mass
or calcification. Internal blood flow present on color Doppler
imaging.

Right epididymis:  Small cyst RIGHT epididymal head 6 mm diameter.

Left epididymis: Small cluster of cysts at LEFT epididymal head, 5
mm aggregate greatest diameter.

Hydrocele:  Small RIGHT and trace LEFT hydroceles

Varicocele:  None visualized.

Pulsed Doppler interrogation of both testes demonstrates normal low
resistance arterial and venous waveforms bilaterally.

Other: C-shaped cystic structure identified within the RIGHT
hydrocele adjacent to the epididymal head, 7 x 6 x 7 mm. This has a
nonspecific appearance. This could represent a dilated segment of
vas deferens or a unusual complicated cyst.
IMPRESSION: Normal appearing testes with small cysts within the epididymi
bilaterally.

BILATERAL hydroceles greater on RIGHT.

No evidence of testicular mass or torsion.

7 mm C shaped tubular cystic structure within RIGHT hydrocele,
potentially a dilated segment of vas deferens or less likely an
unusual complicated cyst.

## 2023-06-10 IMAGING — MR MR CERVICAL SPINE W/O CM
4 of 5 series · 27 of 48 positions shown · non-contrast
Comparison: None.

CLINICAL DATA: Neck pain, thoracic back pain unspecified laterality
in chronicity, right groin pain; technologist note states pain
between shoulders, mid back pain

EXAM:
MRI CERVICAL AND THORACIC SPINE WITHOUT CONTRAST
TECHNIQUE: Multiplanar and multiecho pulse sequences of the cervical spine, to
include the craniocervical junction and cervicothoracic junction,
and the thoracic spine, were obtained without intravenous contrast.

[Series 5: T1 · sagittal · 3.0mm · 0.66mm/px · 6 of 15 slices shown]
[im 1/15]
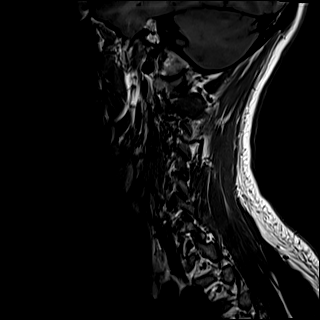
[im 3/15]
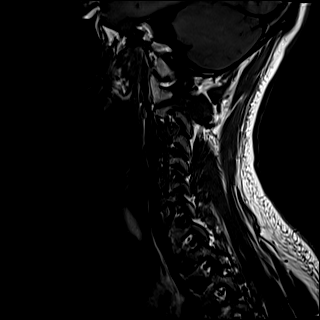
[im 6/15]
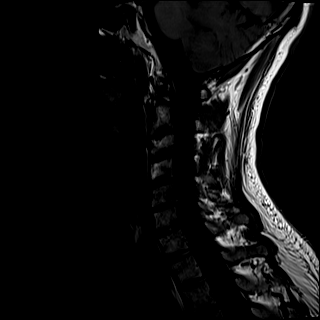
[im 9/15]
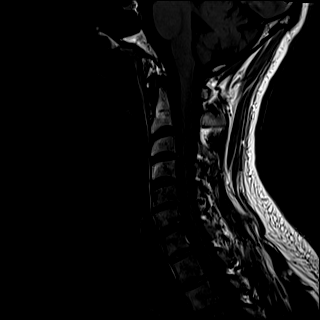
[im 12/15]
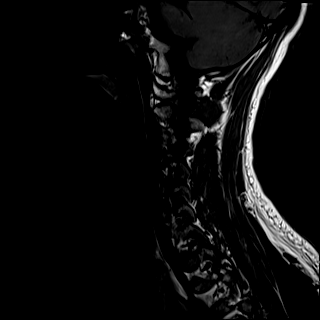
[im 15/15]
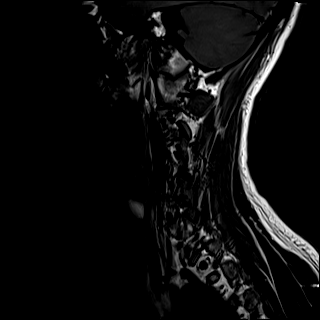

[Series 6: STIR · sagittal · 3.0mm · 0.33mm/px · 6 of 15 slices shown]
[im 1/15]
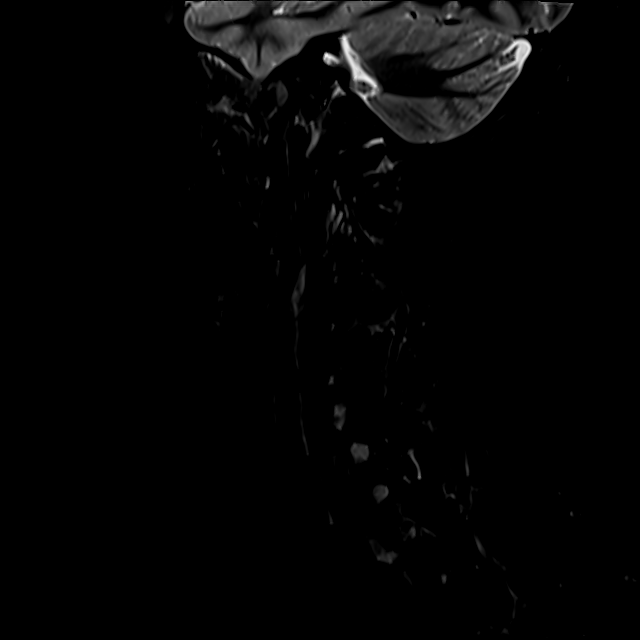
[im 3/15]
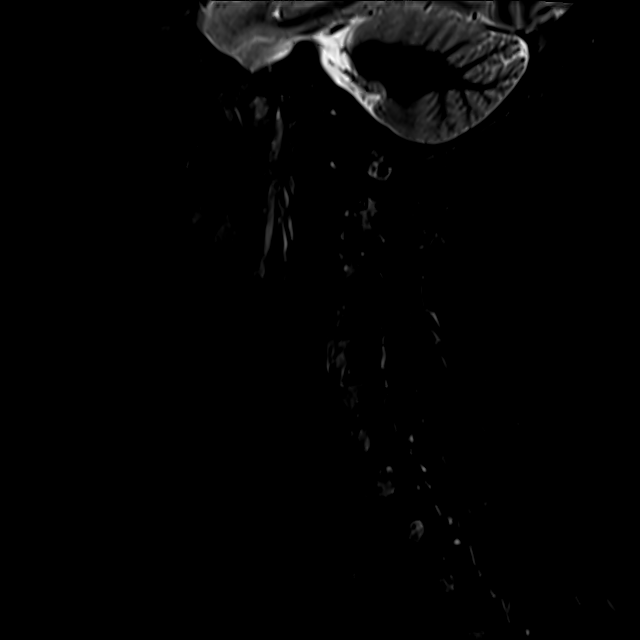
[im 5/15]
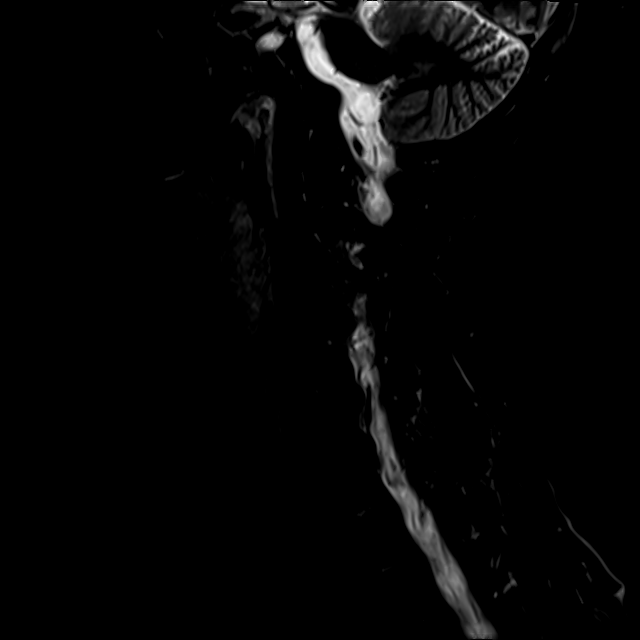
[im 8/15]
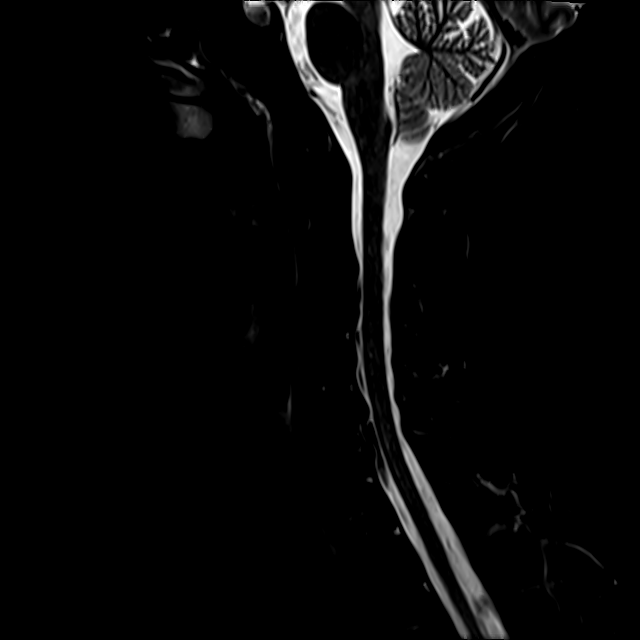
[im 10/15]
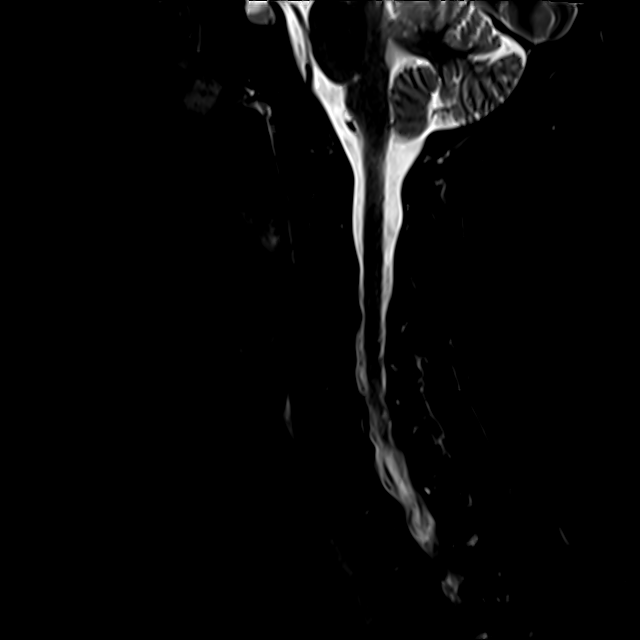
[im 12/15]
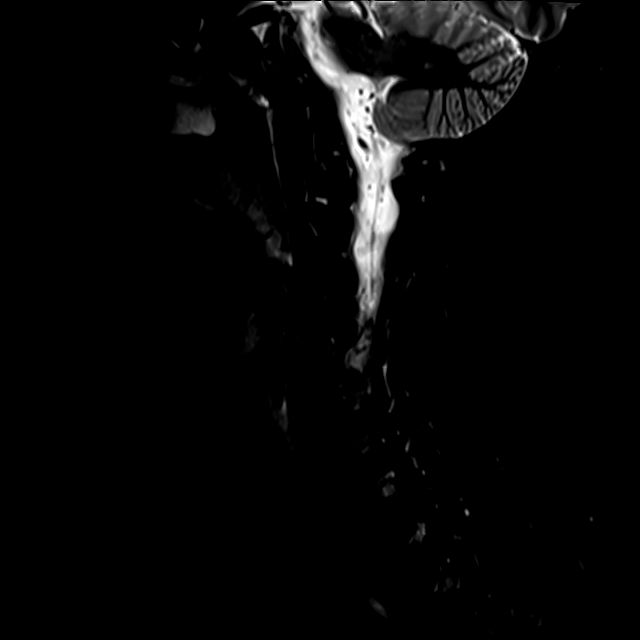

[Series 7: T2 · sagittal · 3.0mm · 0.55mm/px · 7 of 15 slices shown (1 of 2)]
[im 1/15]
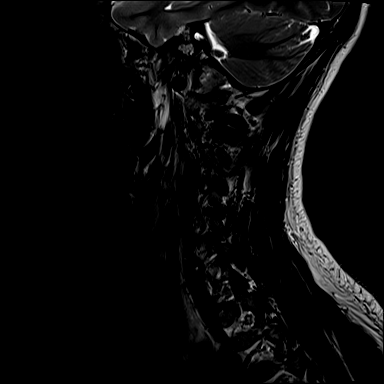
[im 3/15]
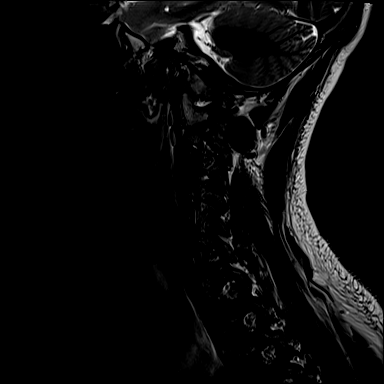
[im 5/15]
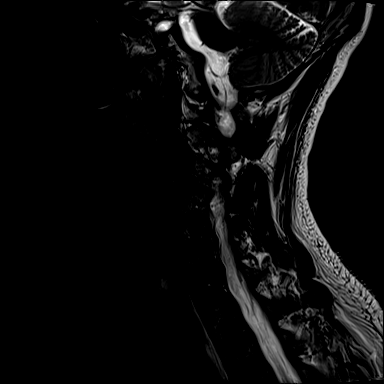
[im 8/15]
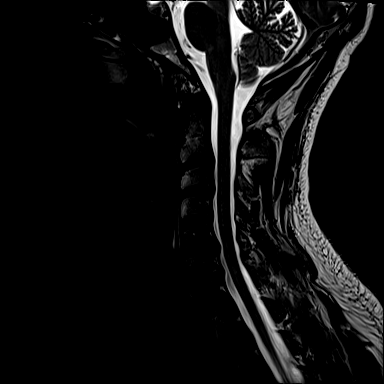
[im 10/15]
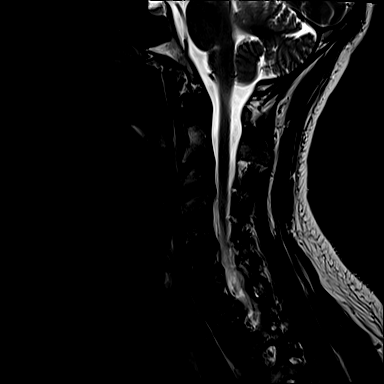
[im 12/15]
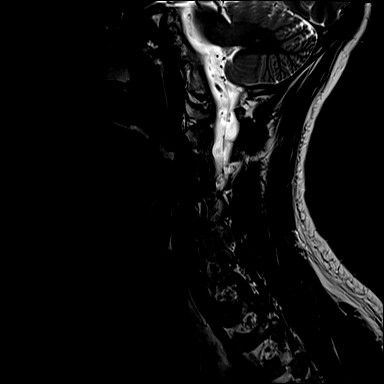
[im 15/15]
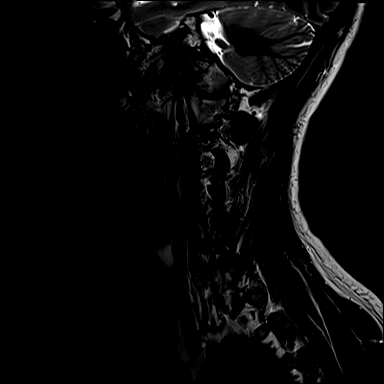

[Series 8: T2 · axial · 3.0mm · 0.50mm/px · z∈[-70,+26]mm · 8 of 31 slices shown (2 of 2)]
[im 1/31]
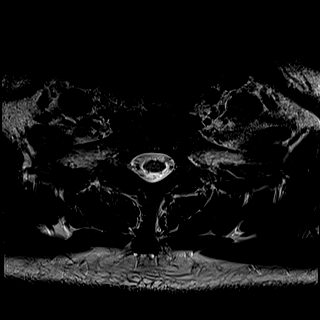
[im 5/31]
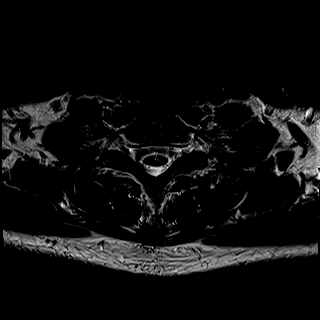
[im 10/31]
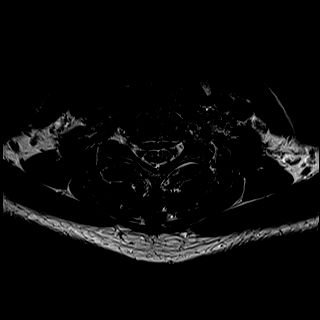
[im 14/31]
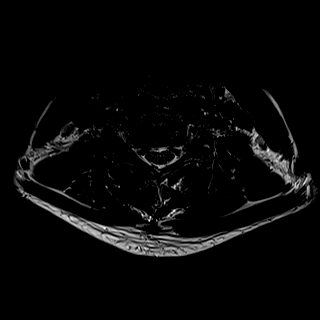
[im 17/31]
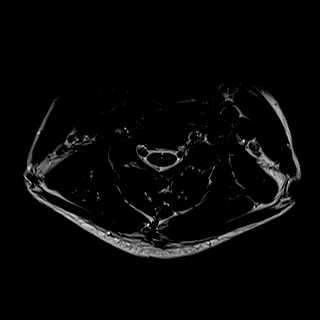
[im 21/31]
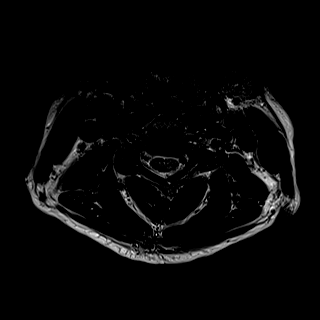
[im 26/31]
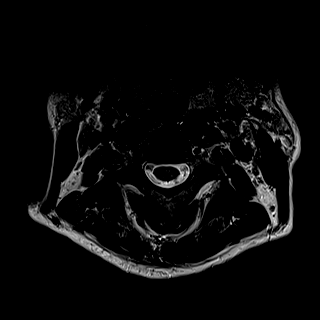
[im 31/31]
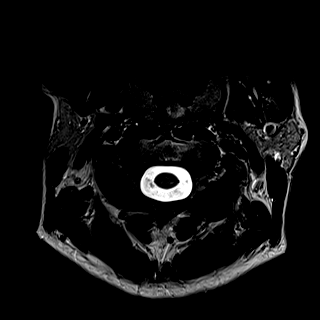

[27 of 48 positions shown; findings below may reference images not displayed]

FINDINGS: MRI CERVICAL SPINE

Alignment: No significant listhesis.

Vertebrae: Vertebral body heights are maintained. No marrow edema.
No suspicious osseous lesion.

Cord: Normal caliber and signal.

Posterior Fossa, vertebral arteries, paraspinal tissues:
Unremarkable.

Disc levels:

C2-C3: Minimal disc bulge.  No canal or foraminal stenosis.

C3-C4:  Minimal disc bulge.  No canal or foraminal stenosis.

C4-C5:  No canal or foraminal stenosis.

C5-C6: Mild disc bulge with endplate osteophytes. Uncovertebral
hypertrophy. Minor canal stenosis. Mild foraminal stenosis.

C6-C7: Disc bulge with endplate osteophytes. Uncovertebral
hypertrophy. No canal stenosis. Minor foraminal stenosis.

C7-T1:  No canal or foraminal stenosis.

MRI THORACIC SPINE

Alignment:  No significant listhesis.

Vertebrae: Mid thoracic degenerative endplate irregularity and
Schmorl's nodes. No marrow edema. No suspicious osseous lesion.

Cord:  No abnormal signal.

Paraspinal and other soft tissues: Unremarkable.

Disc levels: Degenerative disc disease is present at several levels.
For example, there is a right central protrusion at T5-T6 and
central protrusions at T7-T8, T8-T9, and T9-T10. The protrusion at
T5-T6 mildly distorts the right ventral cord. There is no
significant canal stenosis. No foraminal stenosis.
IMPRESSION: Multilevel cervical degenerative changes as detailed above without
high-grade stenosis.

Primarily mid-thoracic degenerative changes without stenosis. A
right central disc protrusion at T5-T6 mildly distorts the cord.

## 2023-08-28 ENCOUNTER — Ambulatory Visit: Attending: Orthopedic Surgery

## 2023-08-28 DIAGNOSIS — M25521 Pain in right elbow: Secondary | ICD-10-CM | POA: Diagnosis present

## 2023-08-28 NOTE — Addendum Note (Signed)
 Addended by: ELSPETH LACINDA BROCKS on: 08/28/2023 04:41 PM   Modules accepted: Orders

## 2023-08-28 NOTE — Therapy (Addendum)
 OUTPATIENT PHYSICAL THERAPY UPPER EXTREMITY EVALUATION   Patient Name: Darius Young MRN: [id number] DOB:Feb 19, 1970, 53 y.o., male Today's Date: 08/28/2023  END OF SESSION:  PT End of Session - 08/28/23 0920     Visit Number 1    Number of Visits 1    Date for PT Re-Evaluation 08/29/23    PT Start Time 0932    PT Stop Time 1038    PT Time Calculation (min) 66 min          Past Medical History:  Diagnosis Date   Back pain    Past Surgical History:  Procedure Laterality Date   EPIDIDYMECTOMY Right 10/05/2021   Procedure: EPIDIDYMECTOMY;  Surgeon: Sherrilee Belvie CROME, MD;  Location: [facility];  Service: Urology;  Laterality: Right;   FOOT SURGERY Right    INGUINAL HERNIA REPAIR Right 07/21/2021   Procedure: HERNIA REPAIR INGUINAL ADULT WITH MESH;  Surgeon: Mavis Anes, MD;  Location: [facility];  Service: General;  Laterality: Right;   Patient Active Problem List   Diagnosis Date Noted   Right inguinal hernia    Rib pain on left side 09/22/2020    PCP: Dow Longs, PA-C  REFERRING PROVIDER: Heyward Pastor, MD  REFERRING DIAG:   Pain in unspecified elbow  R29.898 (ICD-10-CM) - Other symptoms and signs involving the musculoskeletal system  M67.89 (ICD-10-CM) - Other specified disorders of synovium and tendon, multiple sites  M25.521 (ICD-10-CM) - Pain in right elbow  S59.901S (ICD-10-CM) - Unspecified injury of right elbow, sequela    THERAPY DIAG:  Pain in right elbow  Rationale for Evaluation and Treatment: Rehabilitation  ONSET DATE: Early January  SUBJECTIVE:                                                                                                                                                                                      SUBJECTIVE STATEMENT: Patient states he is having severe elbow pain in the right arm. He already had an MRI and he states they want him to do physical therapy before they can schedule a surgery. He states there is a 83mmx8mm  tear in his forearm and he is in a lot of pain. Patient states online physical therapy didn't help, and they stated they couldn't help him. Patient states he thought it was arthritis, it was popping and a little bit painful, but it has become worse over time. Patient states when he is walking and his arm swings it hurts. Patient tried using a brace, but states it didn't help at all. The MD gave him cortisone shots in both elbows and they helped for about 25-30 days. Patient states they saw a  chiropractor 3 times over 2 weeks,  but it was too painful and was rejected by workers compensation.   Hand dominance: Right  PERTINENT HISTORY: Surgical History, former smokeless tobacco user  PAIN:  Are you having pain? Yes: NPRS scale: 7/10, worse 9/10 Pain location: posterior elbow, olecranon, down posterior forearm Pain description: achey, sharp when uses it, goes down arm- tightness Aggravating factors: walking, using the arm at all, pushing buttons Relieving factors: pain medication,   PRECAUTIONS: None  RED FLAGS: None   WEIGHT BEARING RESTRICTIONS: No  FALLS:  Has patient fallen in last 6 months? No  LIVING ENVIRONMENT: Lives with: lives with their spouse Lives in: House/apartment   OCCUPATION: Delivers gasoline, requires heavy lifting  PLOF: Independent  PATIENT GOALS: pain reduction, surgery  NEXT MD VISIT: 09/23/2023  OBJECTIVE:  Note: Objective measures were completed at Evaluation unless otherwise noted.  PATIENT SURVEYS :  QuickDash:  77/100  COGNITION: Overall cognitive status: Within functional limits for tasks assessed     POSTURE: Increased thoracic kyphosis  UPPER EXTREMITY ROM:   Active ROM Right eval Left eval  Shoulder flexion    Shoulder extension    Shoulder abduction    Shoulder adduction    Shoulder internal rotation    Shoulder external rotation    Elbow flexion 51 PROM 87 68  Elbow extension 128 PROM:144 180  Wrist flexion    Wrist  extension    Wrist ulnar deviation    Wrist radial deviation    Wrist pronation 60 78  Wrist supination 72 83  (Blank rows = not tested)  UPPER EXTREMITY MMT:  MMT Right eval Left eval  Shoulder flexion    Shoulder extension    Shoulder abduction    Shoulder adduction    Shoulder internal rotation    Shoulder external rotation    Middle trapezius    Lower trapezius    Elbow flexion    Elbow extension    Wrist flexion    Wrist extension    Wrist ulnar deviation    Wrist radial deviation    Wrist pronation    Wrist supination    Grip strength (lbs) 8lbs 64lbs  (Blank rows = not tested)  SHOULDER SPECIAL TESTS: Mausley's test (positive) Cozens test (positive)   JOINT MOBILITY TESTING:  Not tested due to pain   PALPATION:  TTP: radial head, extensor carpi radialis, extensor digitorum brevis, 3rd digit tendon.                                                                                                                             TREATMENT DATE:    PATIENT EDUCATION: Education details: Patient educated on pain neuroscience education, muscular pain patterns, limitations Person educated: Patient Education method: Explanation Education comprehension: verbalized understanding  HOME EXERCISE PROGRAM: Elbow flexion Isometrics   ASSESSMENT:  CLINICAL IMPRESSION: Patient is a 53 y.o. M who was seen today for physical therapy evaluation and treatment for right  elbow pain with tendon tears. Patient presents with significant pain reporting 8/10 currently that exacerbates to a 10/10 with any movement. AROM of R arm in significantly reduced in all motions, and PROM improved slightly. Positive mausley's and cozen's tests positive, along with significant radial head pain corresponds to lateral epicondyle pain. Patient states they want surgery, since this issue is now chronic and has become worse over the past few months. Patient educated on maintaining motion and strength to  tolerance prior to surgery. Patient educated on the limitations of physical therapy for their condition, and patient opts to not continue with therapy at this time, stating they would come back after surgery.   PHYSICAL THERAPY DISCHARGE SUMMARY  Visits from Start of Care: 1  Current functional level related to goals / functional outcomes: Patient is being discharged at this time to follow up with his referring physician due to his poor prognosis with physical therapy. Recommend additional medical intervention to address his impairments to return to her prior level of function.    Remaining deficits: See above   Education / Equipment: See patient education   Patient agrees to discharge. Patient goals were not met. Patient is being discharged due to poor therapy prognosis and patient request.   Lacinda Fass, PT, DPT   OBJECTIVE IMPAIRMENTS: decreased endurance, decreased mobility, decreased ROM, decreased strength, and pain.   ACTIVITY LIMITATIONS: carrying, lifting, sleeping, transfers, dressing, and reach over head  PARTICIPATION LIMITATIONS: cleaning and occupation  PERSONAL FACTORS: Past/current experiences, Time since onset of injury/illness/exacerbation, and 1 comorbidity: former surgeries are also affecting patient's functional outcome.   REHAB POTENTIAL: poor  CLINICAL DECISION MAKING: Evolving/moderate complexity  EVALUATION COMPLEXITY: Moderate    PLAN: PT FREQUENCY: one time visit  PT DURATION: 1 week  PLANNED INTERVENTIONS: 97164- PT Re-evaluation, 97110-Therapeutic exercises, 97530- Therapeutic activity, [id number]- Neuromuscular re-education, 97535- Self Care, and [id number]- Manual therapy  PLAN FOR NEXT SESSION:    Estefana Jude, Student-PT 08/28/2023, 11:55 AM
